# Patient Record
Sex: Female | Born: 1970
Health system: Southern US, Community
[De-identification: ages and names within clinical notes are randomized; demographics above are authoritative.]

## PROBLEM LIST (undated history)

## (undated) DIAGNOSIS — E559 Vitamin D deficiency, unspecified: Secondary | ICD-10-CM

## (undated) DIAGNOSIS — I10 Essential (primary) hypertension: Secondary | ICD-10-CM

## (undated) DIAGNOSIS — E042 Nontoxic multinodular goiter: Secondary | ICD-10-CM

## (undated) HISTORY — DX: Vitamin D deficiency, unspecified: E55.9

## (undated) HISTORY — DX: Essential (primary) hypertension: I10

## (undated) HISTORY — DX: Nontoxic multinodular goiter: E04.2

---

## 1992-10-16 DIAGNOSIS — E0500 Thyrotoxicosis with diffuse goiter without thyrotoxic crisis or storm: Secondary | ICD-10-CM

## 1992-10-16 HISTORY — DX: Thyrotoxicosis with diffuse goiter without thyrotoxic crisis or storm: E05.00

## 2016-10-31 DIAGNOSIS — F329 Major depressive disorder, single episode, unspecified: Secondary | ICD-10-CM | POA: Insufficient documentation

## 2016-10-31 DIAGNOSIS — I10 Essential (primary) hypertension: Secondary | ICD-10-CM | POA: Insufficient documentation

## 2016-10-31 DIAGNOSIS — Z8639 Personal history of other endocrine, nutritional and metabolic disease: Secondary | ICD-10-CM | POA: Insufficient documentation

## 2016-10-31 DIAGNOSIS — F32A Depression, unspecified: Secondary | ICD-10-CM | POA: Insufficient documentation

## 2016-10-31 DIAGNOSIS — F419 Anxiety disorder, unspecified: Secondary | ICD-10-CM | POA: Insufficient documentation

## 2020-03-11 DIAGNOSIS — R7401 Elevation of levels of liver transaminase levels: Secondary | ICD-10-CM | POA: Diagnosis not present

## 2020-03-11 DIAGNOSIS — R7402 Elevation of levels of lactic acid dehydrogenase (LDH): Secondary | ICD-10-CM | POA: Diagnosis not present

## 2020-03-11 DIAGNOSIS — R932 Abnormal findings on diagnostic imaging of liver and biliary tract: Secondary | ICD-10-CM | POA: Diagnosis not present

## 2020-04-01 ENCOUNTER — Ambulatory Visit (INDEPENDENT_AMBULATORY_CARE_PROVIDER_SITE_OTHER): Payer: BC Managed Care – PPO | Admitting: Gastroenterology

## 2020-05-24 ENCOUNTER — Ambulatory Visit (INDEPENDENT_AMBULATORY_CARE_PROVIDER_SITE_OTHER): Payer: BC Managed Care – PPO | Admitting: Gastroenterology

## 2021-02-09 ENCOUNTER — Telehealth: Payer: Self-pay | Admitting: Internal Medicine

## 2021-02-09 NOTE — Telephone Encounter (Signed)
Pt calling in to see if we have received labs and a thyroid scan, from  [facility].  Pt would like a call back.

## 2021-02-10 ENCOUNTER — Encounter: Payer: Self-pay | Admitting: Internal Medicine

## 2021-02-10 ENCOUNTER — Other Ambulatory Visit: Payer: Self-pay

## 2021-02-10 ENCOUNTER — Ambulatory Visit: Payer: BC Managed Care – PPO | Admitting: Internal Medicine

## 2021-02-10 VITALS — BP 120/78 | HR 61 | Ht 68.0 in | Wt 218.8 lb

## 2021-02-10 DIAGNOSIS — Z8639 Personal history of other endocrine, nutritional and metabolic disease: Secondary | ICD-10-CM | POA: Diagnosis not present

## 2021-02-10 DIAGNOSIS — E042 Nontoxic multinodular goiter: Secondary | ICD-10-CM | POA: Diagnosis not present

## 2021-02-10 NOTE — Patient Instructions (Addendum)
Please stop Levothyroxine.   Please come back for labs in 5 weeks.  We will repeat another thyroid U/S in 1 year from the previous.  Let's have you back towards the end on 11/2021. Send me a message at the beginning of February to order the new U/S.   Thyroid Nodule  A thyroid nodule is an isolated growth of thyroid cells that forms a lump in your thyroid gland. The thyroid gland is a butterfly-shaped gland. It is found in the lower front of your neck. This gland sends chemical messengers (hormones) through your blood to all parts of your body. These hormones are important in regulating your body temperature and helping your body to use energy. Thyroid nodules are common. Most are not cancerous (benign). You may have one nodule or several nodules. Different types of thyroid nodules include nodules that:  Grow and fill with fluid (thyroid cysts).  Produce too much thyroid hormone (hot nodules or hyperthyroid).  Produce no thyroid hormone (cold nodules or hypothyroid).  Form from cancer cells (thyroid cancers). What are the causes? In most cases, the cause of this condition is not known. What increases the risk? The following factors may make you more likely to develop this condition.  Age. Thyroid nodules become more common in people who are older than 48 years of age.  Gender. ? Benign thyroid nodules are more common in women. ? Cancerous (malignant) thyroid nodules are more common in men.  A family history that includes: ? Thyroid nodules. ? Pheochromocytoma. ? Thyroid carcinoma. ? Hyperparathyroidism.  Certain kinds of thyroid diseases, such as Hashimoto's thyroiditis.  Lack of iodine in your diet.  A history of head and neck radiation, such as from previous cancer treatment. What are the signs or symptoms? In many cases, there are no symptoms. If you have symptoms, they may include:  A lump in your lower neck.  Feeling a lump or tickle in your throat.  Pain in  your neck, jaw, or ear.  Having trouble swallowing. Hot nodules may cause symptoms that include:  Weight loss.  Warm, flushed skin.  Feeling hot.  Feeling nervous.  A racing heartbeat. Cold nodules may cause symptoms that include:  Weight gain.  Dry skin.  Brittle hair. This may also occur with hair loss.  Feeling cold.  Fatigue. Thyroid cancer nodules may cause symptoms that include:  Hard nodules that feel stuck to the thyroid gland.  Hoarseness.  Lumps in the glands near your thyroid (lymph nodes). How is this diagnosed? A thyroid nodule may be felt by your health care provider during a physical exam. This condition may also be diagnosed based on your symptoms. You may also have tests, including:  An ultrasound. This may be done to confirm the diagnosis.  A biopsy. This involves taking a sample from the nodule and looking at it under a microscope.  Blood tests to make sure that your thyroid is working properly.  A thyroid scan. This test uses a radioactive tracer injected into a vein to create an image of the thyroid gland on a computer screen.  Imaging tests such as MRI or CT scan. These may be done if: ? Your nodule is large. ? Your nodule is blocking your airway. ? Cancer is suspected. How is this treated? Treatment depends on the cause and size of your nodule or nodules. If the nodule is benign, treatment may not be necessary. Your health care provider may monitor the nodule to see if it goes away without  treatment. If the nodule continues to grow, is cancerous, or does not go away, treatment may be needed. Treatment may include:  Having a cystic nodule drained with a needle.  Ablation therapy. In this treatment, alcohol is injected into the area of the nodule to destroy the cells. Ablation with heat (thermal ablation) may also be used.  Radioactive iodine. In this treatment, radioactive iodine is given as a pill or liquid that you drink. This substance  causes the thyroid nodule to shrink.  Surgery to remove the nodule. Part or all of your thyroid gland may need to be removed as well.  Medicines. Follow these instructions at home:  Pay attention to any changes in your nodule.  Take over-the-counter and prescription medicines only as told by your health care provider.  Keep all follow-up visits as told by your health care provider. This is important. Contact a health care provider if:  Your voice changes.  You have trouble swallowing.  You have pain in your neck, ear, or jaw that is getting worse.  Your nodule gets bigger.  Your nodule starts to make it harder for you to breathe.  Your muscles look like they are shrinking (muscle wasting). Get help right away if:  You have chest pain.  There is a loss of consciousness.  You have a sudden fever.  You feel confused.  You are seeing or hearing things that other people do not see or hear (having hallucinations).  You feel very weak.  You have mood swings.  You feel very restless.  You feel suddenly nauseous or throw up.  You suddenly have diarrhea. Summary  A thyroid nodule is an isolated growth of thyroid cells that forms a lump in your thyroid gland.  Thyroid nodules are common. Most are not cancerous (benign). You may have one nodule or several nodules.  Treatment depends on the cause and size of your nodule or nodules. If the nodule is benign, treatment may not be necessary.  Your health care provider may monitor the nodule to see if it goes away without treatment. If the nodule continues to grow, is cancerous, or does not go away, treatment may be needed. This information is not intended to replace advice given to you by your health care provider. Make sure you discuss any questions you have with your health care provider. Document Revised: 05/17/2018 Document Reviewed: 05/20/2018 Elsevier Patient Education  2021 [employer].

## 2021-02-10 NOTE — Progress Notes (Signed)
Patient ID: Autumn Harris, female   DOB: Dec 20, 1970, 50 y.o.   MRN: [id number]  This visit occurred during the SARS-CoV-2 public health emergency.  Safety protocols were in place, including screening questions prior to the visit, additional usage of staff PPE, and extensive cleaning of exam room while observing appropriate contact time as indicated for disinfecting solutions.    HPI  Autumn Harris is a 50 y.o.-year-old female, referred by her PCP, Letitia Neri, PA/Dr. Ernst Breach at at [facility], for evaluation for thyroid nodules and history of Graves' disease.  Patient has a history of Graves' disease, diagnosed in 1994, when pregnant with her daughter. She had elevated thyroid antibodies and a scan. She was on PTU then for years. She came off ~10 years ago. She then was off medications during the next 2 pregnancies, but after the pregnancies, she had hypothyroidism. Last pregnancy was in 2005. She was started on LT4 then.  Pt is on levothyroxine 12.5 mcg daily, taken for many years: - in am - fasting - with coffee + creamer + sweetener - at least 3h from b'fast - no calcium - no iron - + multivitamins (Thrive) in am  - at the same time with LT4 - no PPIs - + vitamin D, C, and B complex (30 mcg Biotin) in am - at the same time with LT4  Thyroid U/S (11/23/2020): 2 isoechoic nodules or pseudonodules: Right lobe: 2.7 x 6 x 2.6 cm Left lobe: 1.9 x 5.6 x 2.2 cm Isthmus 0.4 cm Thyroid appears enlarged.  Thyroid echotexture appears heterogeneous bilaterally.  Parenchymal vascularity is within normal range. There are a few isoechoic predominantly solid oval or round nodules in the right thyroid with the largest measuring 1.9 x 10 mm. There are a few isoechoic predominantly solid oval or round nodules in the left thyroid with the largest measuring 12 x 12 mm.   Nodules with this appearance are typically classified as low risk for malignancy.  Some nodules could be  parts of larger nodules or be due to heterogeneous echotexture.   Given nodule size, follow-up is recommended in 1 to 2 years, however evaluation with nuclear medicine thyroid scan can be considered.  Pt denies: - feeling nodules in neck - hoarseness - dysphagia - choking - SOB with lying down  I reviewed pt's thyroid tests: 09/23/2020: TSH 2.05 (0.278-4.326), free T4 1.09 02/12/2020: TSH 0.808, free T4 1.15 No results found for: TSH, FREET4   Pt mentions: - fatigue - weight gain - hot flashes  But denies: - tremors - palpitations - anxiety/depression - hyperdefecation/constipation - weight loss/ - dry skin - hair loss  + FH of thyroid ds. No FH of thyroid cancer. No h/o radiation tx to head or neck.  No recent contrast studies. No steroid use. No herbal supplements. + Low-dose biotin supplement, but not in the last few days (see above).  Pt also has a history of transaminitis, HTN (on propranolol for almost 30 years and also HCTZ and lisinopril), depression, anxiety, vitamin D deficiency.   ROS: Constitutional: + See HPI, + increased urination Eyes: no blurry vision, no xerophthalmia ENT: no sore throat,  + see HPI Cardiovascular: no CP/SOB/palpitations/leg swelling Respiratory: no cough/SOB Gastrointestinal: no N/V/D/C Musculoskeletal: no muscle/joint aches Skin: no rashes Neurological: no tremors/numbness/tingling/dizziness Psychiatric: + both depression/anxiety  Past Medical History:  Diagnosis Date  . Graves disease 1994   Resolved  . HTN (hypertension)   . Multiple thyroid nodules   . Multiple thyroid nodules   .  Vitamin D deficiency    Social History   Socioeconomic History  . Marital status: Single    Spouse name: Not on file  . Number of children: 4  . Years of education: Not on file  . Highest education level: Not on file  Occupational History  . Occupation: spinner (textile)  Tobacco Use  . Smoking status: Former Games developer  . Smokeless  tobacco: Never Used  Substance and Sexual Activity  . Alcohol use: Never  . Drug use: Never  . Sexual activity: Not on file  Other Topics Concern  . Not on file  Social History Narrative  . Not on file   Social Determinants of Health   Financial Resource Strain: Not on file  Food Insecurity: Not on file  Transportation Needs: Not on file  Physical Activity: Not on file  Stress: Not on file  Social Connections: Not on file  Intimate Partner Violence: Not on file   Current Outpatient Medications on File Prior to Visit  Medication Sig Dispense Refill  . levothyroxine (SYNTHROID) 25 MCG tablet 1/2 tablet daily    . lisinopril-hydrochlorothiazide (ZESTORETIC) 10-12.5 MG tablet Take 1 tablet by mouth daily.    . propranolol (INDERAL) 40 MG tablet Take 40 mg by mouth 2 (two) times daily.    Marland Kitchen REXULTI 1 MG TABS tablet Take 1 mg by mouth daily.    Marland Kitchen venlafaxine XR (EFFEXOR-XR) 150 MG 24 hr capsule Take 150 mg by mouth daily.    . Vitamin D, Ergocalciferol, (DRISDOL) 1.25 MG (50000 UNIT) CAPS capsule Take 1 capsule by mouth once a week.     No current facility-administered medications on file prior to visit.   No Known Allergies No family history on file.  PE: BP 120/78 (BP Location: Right Arm, Patient Position: Sitting, Cuff Size: Normal)   Pulse 61   Ht 5\' 8"  (1.727 m)   Wt 218 lb 12.8 oz (99.2 kg)   SpO2 97%   BMI 33.27 kg/m  Wt Readings from Last 3 Encounters:  02/10/21 218 lb 12.8 oz (99.2 kg)   Constitutional: overweight, in NAD Eyes: PERRLA, EOMI, no exophthalmos ENT: moist mucous membranes, no thyromegaly, no cervical lymphadenopathy Cardiovascular: RRR, No MRG Respiratory: CTA B Gastrointestinal: abdomen soft, NT, ND, BS+ Musculoskeletal: no deformities, strength intact in all 4;  Skin: moist, warm, no rashes Neurological: no tremor with outstretched hands, DTR normal in all 4  ASSESSMENT: 1. Thyroid nodules  2.  History of Graves' disease -  resolved  PLAN: 1. Thyroid nodules - I reviewed the report of her recent thyroid ultrasound from 11/23/2020 per records from PCP.  I do not have the images.  The thyroid is slightly enlarged with heterogeneous aspect, consistent with her history of thyroid inflammation in the setting of Graves' disease.  She has 2 dominant nodules, a 1.9 cm nodule in the right lobe and a 1.2 cm nodule in the left lobe, along with other, smaller nodules scattered throughout her thyroid, which was not formally characterized on the report.  The dominant nodules appear to be around or oval in shape and they were also isoechoic in appearance.  We discussed that this is a benign finding.  There is no comment about internal calcifications, internal blood flow, more taller than wide disposition, to imply increased risk of malignancy. - Pt does not have a thyroid cancer family history or a personal history of RxTx to head/neck. All these would favor benignity.  - She denies neck compression symptoms - At  this visit, I suggested to repeat the ultrasound in a year from the previous and review the ultrasound characteristics together.  If the nodules appear to have grown significantly or change ultrasound characteristics, we may need to biopsy them.  However, for now, I would not suggest this. - We did discuss that if one of the thyroid nodules turns out to be cancerous in the future, thyroid cancer is a slow-growing cancer with very good prognosis. - I advised her to get in touch with me around 11/2021 so I can order the new thyroid ultrasound and to see her back in the clinic 2 to 3 weeks afterwards so we can review the images together and discuss about further plan. - I advised pt to join my chart   2.  History of Graves' disease -Diagnosed many years, during her first pregnancy. -She was initially on PTU, but then came off and approximately 10 years ago she developed hypothyroidism. -She was started on LT4 12.5 mcg daily and she  has been on the same dose since then -Upon questioning, she is not taking it correctly: She takes it along with multivitamins and also with coffee + creamer.  Therefore, I do not feel that she is absorbing any of the LT4 active substance.  Since she is on such a low-dose and is not absorbing it well, I feel that her hypothyroidism has resolved and I think she can safely come off.  We will stop the medication and recheck her TFTs in 5 weeks. However, we discussed about how to take the thyroid hormone correctly in case we need to start back: Every day, with water, >30 minutes before breakfast, separated by >4 hours from acid reflux medications, calcium, iron, multivitamins.  Orders Placed This Encounter  Procedures  . TSH  . T4, free  . T3, free   Carlus Pavlov, MD PhD [facility]

## 2021-02-10 NOTE — Telephone Encounter (Signed)
Called and advised pt paperwork received.

## 2021-03-17 ENCOUNTER — Other Ambulatory Visit: Payer: BC Managed Care – PPO

## 2021-05-27 DIAGNOSIS — Z1331 Encounter for screening for depression: Secondary | ICD-10-CM | POA: Diagnosis not present

## 2021-05-27 DIAGNOSIS — Z7251 High risk heterosexual behavior: Secondary | ICD-10-CM | POA: Diagnosis not present

## 2021-05-27 DIAGNOSIS — N3281 Overactive bladder: Secondary | ICD-10-CM | POA: Diagnosis not present

## 2021-05-27 DIAGNOSIS — Z01419 Encounter for gynecological examination (general) (routine) without abnormal findings: Secondary | ICD-10-CM | POA: Diagnosis not present

## 2021-05-27 DIAGNOSIS — R8290 Unspecified abnormal findings in urine: Secondary | ICD-10-CM | POA: Diagnosis not present

## 2021-05-27 DIAGNOSIS — Z113 Encounter for screening for infections with a predominantly sexual mode of transmission: Secondary | ICD-10-CM | POA: Diagnosis not present

## 2021-07-16 DIAGNOSIS — I10 Essential (primary) hypertension: Secondary | ICD-10-CM | POA: Diagnosis not present

## 2021-07-16 DIAGNOSIS — R7989 Other specified abnormal findings of blood chemistry: Secondary | ICD-10-CM | POA: Diagnosis not present

## 2021-07-16 DIAGNOSIS — Z20822 Contact with and (suspected) exposure to covid-19: Secondary | ICD-10-CM | POA: Diagnosis not present

## 2021-07-16 DIAGNOSIS — R519 Headache, unspecified: Secondary | ICD-10-CM | POA: Diagnosis not present

## 2021-07-19 DIAGNOSIS — I10 Essential (primary) hypertension: Secondary | ICD-10-CM | POA: Diagnosis not present

## 2021-07-19 DIAGNOSIS — Z20822 Contact with and (suspected) exposure to covid-19: Secondary | ICD-10-CM | POA: Diagnosis not present

## 2021-07-19 DIAGNOSIS — G932 Benign intracranial hypertension: Secondary | ICD-10-CM | POA: Diagnosis not present

## 2021-07-19 DIAGNOSIS — R231 Pallor: Secondary | ICD-10-CM | POA: Diagnosis not present

## 2021-07-19 DIAGNOSIS — F32A Depression, unspecified: Secondary | ICD-10-CM | POA: Diagnosis not present

## 2021-07-19 DIAGNOSIS — F419 Anxiety disorder, unspecified: Secondary | ICD-10-CM | POA: Diagnosis not present

## 2021-07-19 DIAGNOSIS — R7401 Elevation of levels of liver transaminase levels: Secondary | ICD-10-CM | POA: Diagnosis not present

## 2021-07-19 DIAGNOSIS — R52 Pain, unspecified: Secondary | ICD-10-CM | POA: Diagnosis not present

## 2021-07-19 DIAGNOSIS — R519 Headache, unspecified: Secondary | ICD-10-CM | POA: Diagnosis not present

## 2021-07-19 DIAGNOSIS — R0689 Other abnormalities of breathing: Secondary | ICD-10-CM | POA: Diagnosis not present

## 2021-07-19 DIAGNOSIS — G4489 Other headache syndrome: Secondary | ICD-10-CM | POA: Diagnosis not present

## 2021-07-20 DIAGNOSIS — G932 Benign intracranial hypertension: Secondary | ICD-10-CM | POA: Diagnosis not present

## 2021-07-20 DIAGNOSIS — F419 Anxiety disorder, unspecified: Secondary | ICD-10-CM | POA: Diagnosis not present

## 2021-07-20 DIAGNOSIS — F32A Depression, unspecified: Secondary | ICD-10-CM | POA: Diagnosis not present

## 2021-07-20 DIAGNOSIS — I10 Essential (primary) hypertension: Secondary | ICD-10-CM | POA: Diagnosis not present

## 2021-07-28 DIAGNOSIS — H02831 Dermatochalasis of right upper eyelid: Secondary | ICD-10-CM | POA: Diagnosis not present

## 2021-07-28 DIAGNOSIS — I10 Essential (primary) hypertension: Secondary | ICD-10-CM | POA: Diagnosis not present

## 2021-07-28 DIAGNOSIS — G932 Benign intracranial hypertension: Secondary | ICD-10-CM | POA: Diagnosis not present

## 2021-07-28 DIAGNOSIS — H02834 Dermatochalasis of left upper eyelid: Secondary | ICD-10-CM | POA: Diagnosis not present

## 2021-07-29 ENCOUNTER — Ambulatory Visit: Payer: BC Managed Care – PPO | Admitting: Neurology

## 2021-07-29 ENCOUNTER — Encounter: Payer: Self-pay | Admitting: Neurology

## 2021-07-29 ENCOUNTER — Telehealth: Payer: Self-pay | Admitting: Neurology

## 2021-07-29 VITALS — BP 109/73 | HR 70 | Ht 68.0 in | Wt 211.0 lb

## 2021-07-29 DIAGNOSIS — R0602 Shortness of breath: Secondary | ICD-10-CM

## 2021-07-29 DIAGNOSIS — G932 Benign intracranial hypertension: Secondary | ICD-10-CM | POA: Diagnosis not present

## 2021-07-29 MED ORDER — ONDANSETRON 4 MG PO TBDP: 4.0000 mg | ORAL_TABLET | Freq: Three times a day (TID) | ORAL | 6 refills | Status: DC | PRN

## 2021-07-29 MED ORDER — TOPIRAMATE 100 MG PO TABS: 100.0000 mg | ORAL_TABLET | Freq: Two times a day (BID) | ORAL | 3 refills | Status: DC

## 2021-07-29 MED ORDER — RIZATRIPTAN BENZOATE 10 MG PO TBDP: 10.0000 mg | ORAL_TABLET | ORAL | 6 refills | Status: DC | PRN

## 2021-07-29 MED ORDER — TIZANIDINE HCL 4 MG PO TABS: 4.0000 mg | ORAL_TABLET | Freq: Four times a day (QID) | ORAL | 6 refills | Status: DC | PRN

## 2021-07-29 NOTE — Progress Notes (Signed)
Chief Complaint  Patient presents with   New Patient (Initial Visit)    New room with husband. Paper referral from Ocie Bob, NP for benign IIH. Gets up to walk, gets out of breathe. Within last day, SOB has improved. Has no energy, very fatigued. Has high BP but on med for this. Had [facility] head last week [facility]. Told it was okay per pt. CAT scan showed excessive fluid, had LP that confirmed increased pressure, fluid taken off.       ASSESSMENT AND PLAN  Autumn Harris is a 50 y.o. female   Probable pseudotumor cerebri  Normal MRI of the brain, neurological examinations,  Diagnosis was based on elevated opening pressure October 4th  2022, 35 cmH2O  Per patient, recent ophthalmology evaluation by Dr. Mia Creek was normal, will get record Headache  With migraine features  Will switch from Diamox to Topamax 100 mg twice a day as preventive medications, also for the benefit of migraine prevention, weight loss  Maxalt 10 mg as needed, may mix together with tizanidine, Zofran, Aleve for prolonged severe headaches,  Return to clinic with nurse practitioner in 2 months   DIAGNOSTIC DATA (LABS, IMAGING, TESTING) - I reviewed patient records, labs, notes, testing and imaging myself where available.   MEDICAL HISTORY:  Autumn Harris, is a 50 year old female, accompanied by her husband, seen in request by her primary care PA [address] R, elevation of 2 episodes severe headache, elevated opening pressure on lumbar puncture, initial evaluation was July 29, 2021   I reviewed and summarized the referring note. PMhx. Depression, anxiety. HTN  She presented to emergency room [facility] on July 16, 2021, after sudden onset of severe headache during climax, also has shortness of breath, sweating, CT head without contrast showed no significant abnormality, EKG was normal, did has transient elevated troponin, but quickly normalized on repeat test  She  was given Tylenol, Zofran, hydralazine because elevated blood pressure 188/95, and discharged home  Her headache last about 24 hours, had recurrent headache again on July 19, 2021, describes severe pounding headache with light noise sensitivity, nauseous,  This time she had a CT angiogram head and neck without significant abnormality  MRI of the brain, MRV of the brain was normal Lumbar puncture showed opening pressure of 35 cmH2O, no significant abnormality on CSF study,  She was started on Diamox 500 mg twice a day, again headache last for 24 hours, she did have transient whooshing sound in her ear, especially when lying down, now has improved, denies visual change, denies headache.  She was seen by her ophthalmologist Dr.  Mia Creek, reported normal  But over the past couple weeks, she complains of worsening depression anxiety, could not falling to sleep, tearful, stressed at today's visit She denies a previous history of frequent headaches, CSF: July 19, 2021, protein was 45, RBC 1, WBC 1, CBC showed mild elevated WBC 11.1, hemoglobin of 15.5, CMP shows sodium 131, creatinine 0.89, elevated AST 65, ALT 101, UDS was positive for marijuana twice   PHYSICAL EXAM:   Vitals:   07/29/21 0824  BP: 109/73  Pulse: 70  Weight: 211 lb (95.7 kg)  Height: 5\' 8"  (1.727 m)   Not recorded     Body mass index is 32.08 kg/m.  PHYSICAL EXAMNIATION:  Gen: NAD, conversant, well nourised, well groomed                     Cardiovascular: Regular  rate rhythm, no peripheral edema, warm, nontender. Eyes: Conjunctivae clear without exudates or hemorrhage Neck: Supple, no carotid bruits. Pulmonary: Clear to auscultation bilaterally   NEUROLOGICAL EXAM:  MENTAL STATUS: Depressed looking middle-aged female Speech:    Speech is normal; fluent and spontaneous with normal comprehension.  Cognition:     Orientation to time, place and person     Normal recent and remote memory     Normal  Attention span and concentration     Normal Language, naming, repeating,spontaneous speech     Fund of knowledge   CRANIAL NERVES: CN II: Visual fields are full to confrontation. Pupils are round equal and briskly reactive to light. CN III, IV, VI: extraocular movement are normal. No ptosis. CN V: Facial sensation is intact to light touch CN VII: Face is symmetric with normal eye closure  CN VIII: Hearing is normal to causal conversation. CN IX, X: Phonation is normal. CN XI: Head turning and shoulder shrug are intact  MOTOR: There is no pronator drift of out-stretched arms. Muscle bulk and tone are normal. Muscle strength is normal.  REFLEXES: Reflexes are 2+ and symmetric at the biceps, triceps, knees, and ankles. Plantar responses are flexor.  SENSORY: Intact to light touch, pinprick and vibratory sensation are intact in fingers and toes.  COORDINATION: There is no trunk or limb dysmetria noted.  GAIT/STANCE: Posture is normal. Gait is steady with normal steps, base, arm swing, and turning. Heel and toe walking are normal. Tandem gait is normal.  Romberg is absent.  REVIEW OF SYSTEMS:  Full 14 system review of systems performed and notable only for as above All other review of systems were negative.   ALLERGIES: No Known Allergies  HOME MEDICATIONS: Current Outpatient Medications  Medication Sig Dispense Refill   acetaZOLAMIDE (DIAMOX) 250 MG tablet Take 500 mg by mouth 2 (two) times daily.     ALPRAZolam (XANAX) 0.5 MG tablet Take 0.5 mg by mouth 3 (three) times daily.     amitriptyline (ELAVIL) 10 MG tablet Take 10 mg by mouth at bedtime.     baclofen (LIORESAL) 20 MG tablet Take 20 mg by mouth at bedtime.     losartan-hydrochlorothiazide (HYZAAR) 100-25 MG tablet Take 0.5 tablets by mouth at bedtime.     propranolol (INDERAL) 40 MG tablet Take 40 mg by mouth 2 (two) times daily.     REXULTI 1 MG TABS tablet Take 0.5 mg by mouth daily.     venlafaxine XR  (EFFEXOR-XR) 150 MG 24 hr capsule Take 150 mg by mouth daily.     No current facility-administered medications for this visit.    PAST MEDICAL HISTORY: Past Medical History:  Diagnosis Date   Graves disease 1994   Resolved   HTN (hypertension)    Multiple thyroid nodules    Multiple thyroid nodules    Vitamin D deficiency     PAST SURGICAL HISTORY: Past Surgical History:  Procedure Laterality Date   CESAREAN SECTION  2004   x2    FAMILY HISTORY: Family History  Problem Relation Age of Onset   Hypertension Mother    Anxiety disorder Mother    Lung cancer Father     SOCIAL HISTORY: Social History   Socioeconomic History   Marital status: Single    Spouse name: Not on file   Number of children: 4   Years of education: Not on file   Highest education level: Not on file  Occupational History   Occupation: Facilities manager (textile)  Tobacco  Use   Smoking status: Former    Types: E-cigarettes    Quit date: 07/15/2021    Years since quitting: 0.0   Smokeless tobacco: Never  Substance and Sexual Activity   Alcohol use: Never   Drug use: Never   Sexual activity: Not on file  Other Topics Concern   Not on file  Social History Narrative   Right handed   Caffeine use: small amount daily   Social Determinants of Health   Financial Resource Strain: Not on file  Food Insecurity: Not on file  Transportation Needs: Not on file  Physical Activity: Not on file  Stress: Not on file  Social Connections: Not on file  Intimate Partner Violence: Not on file      Levert Feinstein, M.D. Ph.D.  [facility] [address], [address] [place], [place] [address] Ph: [phone] Fax: [phone]  CC:  Terance Hart, PA-C [address] [place],  [place] [address]  Pcp, No

## 2021-07-29 NOTE — Telephone Encounter (Signed)
Get medical record from her neurologist Dr.Bevis, Marcial Pacas

## 2021-08-01 DIAGNOSIS — R9431 Abnormal electrocardiogram [ECG] [EKG]: Secondary | ICD-10-CM | POA: Diagnosis not present

## 2021-08-01 DIAGNOSIS — I10 Essential (primary) hypertension: Secondary | ICD-10-CM | POA: Diagnosis not present

## 2021-08-01 DIAGNOSIS — R079 Chest pain, unspecified: Secondary | ICD-10-CM | POA: Diagnosis not present

## 2021-08-01 DIAGNOSIS — Z6831 Body mass index (BMI) 31.0-31.9, adult: Secondary | ICD-10-CM | POA: Diagnosis not present

## 2021-08-01 NOTE — Telephone Encounter (Signed)
Request made today  

## 2021-08-10 DIAGNOSIS — R9431 Abnormal electrocardiogram [ECG] [EKG]: Secondary | ICD-10-CM | POA: Diagnosis not present

## 2021-08-15 ENCOUNTER — Ambulatory Visit: Payer: BC Managed Care – PPO | Admitting: Psychiatry

## 2021-08-31 DIAGNOSIS — R079 Chest pain, unspecified: Secondary | ICD-10-CM | POA: Diagnosis not present

## 2021-09-02 ENCOUNTER — Telehealth: Payer: Self-pay | Admitting: Neurology

## 2021-09-02 NOTE — Addendum Note (Signed)
Addended by: Lindell Spar C on: 09/02/2021 11:55 AM   Modules accepted: Orders

## 2021-09-02 NOTE — Telephone Encounter (Signed)
I returned the call to the patient. Reports having a near constant headache. She has been using Aleve every day. She has continued topirmate 100mg , one tab BID. She has rizatriptan, ondansetron and tizanidine at home.   I educated her that using daily NSAIDS cause medication rebound headaches. She needs to discontinue use of daily Aleve. Do not treat mild headaches. Only use the prescribed medications above for moderate to severe pain.  She is agreeable to this plan and will try it over the next few days. She will call our office next week, if the headaches do not improve with just the withdrawal of NSAIDS.

## 2021-09-02 NOTE — Telephone Encounter (Signed)
Pt called states she is having a headache everyday, and it seems the medicines we have prescribed for her are not helping with her headaches. She states she is having to take an Aleve everyday and doesn't want to have to do that everyday. Pt requesting a call back.

## 2021-09-06 DIAGNOSIS — H02831 Dermatochalasis of right upper eyelid: Secondary | ICD-10-CM | POA: Diagnosis not present

## 2021-09-06 DIAGNOSIS — I10 Essential (primary) hypertension: Secondary | ICD-10-CM | POA: Diagnosis not present

## 2021-09-06 DIAGNOSIS — H02834 Dermatochalasis of left upper eyelid: Secondary | ICD-10-CM | POA: Diagnosis not present

## 2021-09-06 DIAGNOSIS — G932 Benign intracranial hypertension: Secondary | ICD-10-CM | POA: Diagnosis not present

## 2021-09-12 DIAGNOSIS — Z1231 Encounter for screening mammogram for malignant neoplasm of breast: Secondary | ICD-10-CM | POA: Diagnosis not present

## 2021-10-12 NOTE — Progress Notes (Signed)
PATIENT: Autumn Harris DOB: [birth year]  REASON FOR VISIT: Follow up HISTORY FROM: Patient PRIMARY NEUROLOGIST: Dr. Terrace Arabia   HISTORY  Autumn Harris, is a 50 year old female, accompanied by her husband, seen in request by her primary care PA [address] R, elevation of 2 episodes severe headache, elevated opening pressure on lumbar puncture, initial evaluation was July 29, 2021   I reviewed and summarized the referring note. PMhx. Depression, anxiety. HTN   She presented to emergency room [facility] on July 16, 2021, after sudden onset of severe headache during climax, also has shortness of breath, sweating, CT head without contrast showed no significant abnormality, EKG was normal, did has transient elevated troponin, but quickly normalized on repeat test  She was given Tylenol, Zofran, hydralazine because elevated blood pressure 188/95, and discharged home  Her headache last about 24 hours, had recurrent headache again on July 19, 2021, describes severe pounding headache with light noise sensitivity, nauseous,  This time she had a CT angiogram head and neck without significant abnormality  MRI of the brain, MRV of the brain was normal Lumbar puncture showed opening pressure of 35 cmH2O, no significant abnormality on CSF study,  She was started on Diamox 500 mg twice a day, again headache last for 24 hours, she did have transient whooshing sound in her ear, especially when lying down, now has improved, denies visual change, denies headache.  She was seen by her ophthalmologist Dr.  Mia Creek, reported normal  But over the past couple weeks, she complains of worsening depression anxiety, could not falling to sleep, tearful, stressed at today's visit She denies a previous history of frequent headaches, CSF: July 19, 2021, protein was 45, RBC 1, WBC 1, CBC showed mild elevated WBC 11.1, hemoglobin of 15.5, CMP shows sodium 131, creatinine 0.89, elevated  AST 65, ALT 101, UDS was positive for marijuana twice  Update October 13, 2021 SS: Here today with husband, daughter. On Topamax 100 mg twice daily. Feels cognitive slowing, foggy. 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 No more headaches. Claims didn't give Diamox much of a chance to work, because of coming off the Vaping with THC. Saw eye doctor, Dr. Mia Creek 1 month ago, everything was good. Husband reports snoring, apnea, she admits daytime fatigue, likelihood of falling asleep during the day with normal activities. Tearful today. Anxiety and depression before, but more so lately. Taking Xanax, baclofen, Rexulti, Inderal, Effexor.  REVIEW OF SYSTEMS: Out of a complete 14 system review of symptoms, the patient complains only of the following symptoms, and all other reviewed systems are negative.  See HPI  ALLERGIES: No Known Allergies  HOME MEDICATIONS: Outpatient Medications Prior to Visit  Medication Sig Dispense Refill   ALPRAZolam (XANAX) 0.5 MG tablet Take 0.5 mg by mouth 3 (three) times daily as needed.     amitriptyline (ELAVIL) 10 MG tablet Take 10 mg by mouth at bedtime.     baclofen (LIORESAL) 20 MG tablet Take 20 mg by mouth at bedtime.     losartan-hydrochlorothiazide (HYZAAR) 100-25 MG tablet Take 0.5 tablets by mouth at bedtime.     ondansetron (ZOFRAN ODT) 4 MG disintegrating tablet Take 1 tablet (4 mg total) by mouth every 8 (eight) hours as needed. 20 tablet 6   propranolol (INDERAL) 40 MG tablet Take 40 mg by mouth 2 (two) times daily.     REXULTI 1 MG TABS tablet Take 0.5 mg by mouth daily.  rizatriptan (MAXALT-MLT) 10 MG disintegrating tablet Take 1 tablet (10 mg total) by mouth as needed. May repeat in 2 hours if needed 12 tablet 6   tiZANidine (ZANAFLEX) 4 MG tablet Take 1 tablet (4 mg total) by mouth every 6 (six) hours as needed. 30 tablet 6   topiramate (TOPAMAX) 100 MG tablet Take 1 tablet (100 mg total) by mouth 2 (two) times  daily. 60 tablet 3   venlafaxine XR (EFFEXOR-XR) 150 MG 24 hr capsule Take 150 mg by mouth daily.     No facility-administered medications prior to visit.    PAST MEDICAL HISTORY: Past Medical History:  Diagnosis Date   Graves disease 1994   Resolved   HTN (hypertension)    Multiple thyroid nodules    Multiple thyroid nodules    Vitamin D deficiency     PAST SURGICAL HISTORY: Past Surgical History:  Procedure Laterality Date   CESAREAN SECTION  2004   x2    FAMILY HISTORY: Family History  Problem Relation Age of Onset   Hypertension Mother    Anxiety disorder Mother    Lung cancer Father     SOCIAL HISTORY: Social History   Socioeconomic History   Marital status: Single    Spouse name: Not on file   Number of children: 4   Years of education: Not on file   Highest education level: Not on file  Occupational History   Occupation: Facilities manager (textile)  Tobacco Use   Smoking status: Former    Types: E-cigarettes    Quit date: 07/15/2021    Years since quitting: 0.2   Smokeless tobacco: Never  Substance and Sexual Activity   Alcohol use: Never   Drug use: Never   Sexual activity: Not on file  Other Topics Concern   Not on file  Social History Narrative   Right handed   Caffeine use: small amount daily   Social Determinants of Health   Financial Resource Strain: Not on file  Food Insecurity: Not on file  Transportation Needs: Not on file  Physical Activity: Not on file  Stress: Not on file  Social Connections: Not on file  Intimate Partner Violence: Not on file   PHYSICAL EXAM  Vitals:   10/13/21 0853  BP: 114/73  Pulse: 78  Weight: 211 lb 8 oz (95.9 kg)  Height: 5\' 8"  (1.727 m)   Body mass index is 32.16 kg/m.  Generalized: Well developed, in no acute distress, tired appearing, tearful Neurological examination  Mentation: Alert oriented to time, place, history taking. Follows all commands speech and language fluent Cranial nerve II-XII:  Pupils were equal round reactive to light. Extraocular movements were full, visual field were full on confrontational test. Facial sensation and strength were normal. Head turning and shoulder shrug  were normal and symmetric. Motor: The motor testing reveals 5 over 5 strength of all 4 extremities. Good symmetric motor tone is noted throughout.  Sensory: Sensory testing is intact to soft touch on all 4 extremities. No evidence of extinction is noted.  Coordination: Cerebellar testing reveals good finger-nose-finger and heel-to-shin bilaterally.  Gait and station: Gait is normal. Tandem gait is normal.   Reflexes: Deep tendon reflexes are symmetric and normal bilaterally.   DIAGNOSTIC DATA (LABS, IMAGING, TESTING) - I reviewed patient records, labs, notes, testing and imaging myself where available.  No results found for: WBC, HGB, HCT, MCV, PLT No results found for: NA, K, CL, CO2, GLUCOSE, BUN, CREATININE, CALCIUM, PROT, ALBUMIN, AST, ALT, ALKPHOS, BILITOT, GFRNONAA,  GFRAA No results found for: CHOL, HDL, LDLCALC, LDLDIRECT, TRIG, CHOLHDL No results found for: WGNF6O No results found for: VITAMINB12 No results found for: TSH    ASSESSMENT AND PLAN 51 y.o. year old female  has a past medical history of Graves disease (1994), HTN (hypertension), Multiple thyroid nodules, Multiple thyroid nodules, and Vitamin D deficiency. here with:  1.  Probable pseudotumor cerebri 2.  Headache, with migraine features -No longer complains of significant headache -Will try taking Topamax 200 mg at bedtime, to see if less side effect of cognitive clouding, if no change may consider retry of Diamox, admits she didn't give it much of a chance; also isn't sleeping well, more anxiety contributing as well? -Will request the report from recent ophthalmology visit with Dr. Mia Creek, reportedly was normal -Normal MRI of the brain -Diagnosis made based on elevated opening pressure July 19, 2021, 35 cm  water -On polypharmacy of Xanax, baclofen, Rexulti, propanolol, Effexor -She will follow-up with me in 3 to 4 months or sooner if needed, I have asked her to keep me updated via MyChart  3. Reported snoring, apnea, daytime fatigue -Referral for sleep consultation, rule out obstructive sleep apnea -ESS was 21 -History significant for depression, anxiety; polypharmacy  Margie Ege, AGNP-C, DNP 10/13/2021, 9:03 AM [facility] [address] [place], [place] [address] [phone]

## 2021-10-13 ENCOUNTER — Ambulatory Visit (INDEPENDENT_AMBULATORY_CARE_PROVIDER_SITE_OTHER): Payer: BC Managed Care – PPO | Admitting: Neurology

## 2021-10-13 ENCOUNTER — Encounter: Payer: Self-pay | Admitting: Neurology

## 2021-10-13 ENCOUNTER — Telehealth: Payer: Self-pay | Admitting: Neurology

## 2021-10-13 VITALS — BP 114/73 | HR 78 | Ht 68.0 in | Wt 211.5 lb

## 2021-10-13 DIAGNOSIS — R0683 Snoring: Secondary | ICD-10-CM | POA: Diagnosis not present

## 2021-10-13 DIAGNOSIS — G932 Benign intracranial hypertension: Secondary | ICD-10-CM | POA: Diagnosis not present

## 2021-10-13 NOTE — Patient Instructions (Signed)
Try taking Topamax 200 mg at bedtime to see if less symptoms  Referral for sleep consult  I will get report of eye visit  Follow up with me 3-4 months  Keep me updated via my chart how you are doing

## 2021-10-13 NOTE — Telephone Encounter (Signed)
Stanton Kidney, can you please get records from Dr. Mia Creek most recent eye exam? Thanks!

## 2021-10-18 NOTE — Progress Notes (Signed)
Chart reviewed, agree above plan ?

## 2021-11-06 ENCOUNTER — Encounter: Payer: Self-pay | Admitting: Neurology

## 2021-11-08 ENCOUNTER — Telehealth: Payer: Self-pay | Admitting: Neurology

## 2021-11-08 MED ORDER — ACETAZOLAMIDE 250 MG PO TABS: 500.0000 mg | ORAL_TABLET | Freq: Two times a day (BID) | ORAL | 11 refills | Status: DC

## 2021-11-08 NOTE — Telephone Encounter (Signed)
Patient sent my chart message requesting to switch back to Diamox, reported too many side effect of Topamax.   Meds ordered this encounter  Medications   acetaZOLAMIDE (DIAMOX) 250 MG tablet    Sig: Take 2 tablets (500 mg total) by mouth 2 (two) times daily.    Dispense:  120 tablet    Refill:  11

## 2021-11-09 ENCOUNTER — Telehealth: Payer: Self-pay | Admitting: Neurology

## 2021-11-09 NOTE — Telephone Encounter (Signed)
I received ophthalmology note, September 06, 2021, from Dr. Mia Creek.  Bilaterally, the optic nerve was pink and healthy, no disc edema.   I was able to review with Dr. Terrace Arabia, since eye exam is reassuring, it is ok for her to try to stop Diamox. However, she should let us know immediately of any return of headache. Would then proceed with LP. Needs to see eye doctor every 6 months.   She has had a hard time tolerating the Diamox and Topamax.  I tried to call the patient, there was no answer. Can you try to reach her tomorrow?

## 2021-11-10 ENCOUNTER — Other Ambulatory Visit: Payer: Self-pay | Admitting: Neurology

## 2021-11-10 NOTE — Telephone Encounter (Signed)
Maralyn Sago has sent the patient the following mychart message:  Glean Salvo, NP to Autumn Harris      9:37 AM [name], I got the records yesterday from your eye doctor, the exam didn't show any disc edema. If you are having toleration issues, we could try a discontinuation of medication and observe for any return of symptom. You would need to let me know of any return of headache immediately. Let me know your thoughts!

## 2021-11-10 NOTE — Telephone Encounter (Signed)
I was able to speak to the patient. She was in agreement with stopping the medication. Verbalized understanding to call our office immediately if headaches return.

## 2021-11-16 ENCOUNTER — Ambulatory Visit: Payer: BC Managed Care – PPO | Admitting: Internal Medicine

## 2021-11-16 ENCOUNTER — Encounter: Payer: Self-pay | Admitting: Internal Medicine

## 2021-11-16 ENCOUNTER — Other Ambulatory Visit: Payer: Self-pay

## 2021-11-16 VITALS — BP 110/78 | HR 69 | Ht 68.0 in | Wt 213.2 lb

## 2021-11-16 DIAGNOSIS — E042 Nontoxic multinodular goiter: Secondary | ICD-10-CM | POA: Diagnosis not present

## 2021-11-16 DIAGNOSIS — Z8639 Personal history of other endocrine, nutritional and metabolic disease: Secondary | ICD-10-CM | POA: Diagnosis not present

## 2021-11-16 LAB — T4, FREE: Free T4: 0.87 ng/dL (ref 0.60–1.60)

## 2021-11-16 LAB — T3, FREE: T3, Free: 3.6 pg/mL (ref 2.3–4.2)

## 2021-11-16 LAB — TSH: TSH: 3 u[IU]/mL (ref 0.35–5.50)

## 2021-11-16 NOTE — Patient Instructions (Signed)
Please stop at the lab.  We will check annual thyroid ultrasound.  Please return for another visit in a year.

## 2021-11-16 NOTE — Progress Notes (Addendum)
Patient ID: Autumn AuerbachDana Harris, female   DOB: 10/17/1970, 51 y.o.   MRN: [id number]  This visit occurred during the SARS-CoV-2 public health emergency.  Safety protocols were in place, including screening questions prior to the visit, additional usage of staff PPE, and extensive cleaning of exam room while observing appropriate contact time as indicated for disinfecting solutions.   HPI  Autumn AuerbachDana Harris is a 51 y.o.-year-old female, initially referred by her PCP, Letitia Neriourtney Eure-Hart, PA/Dr. Ernst BreachJohn [facility], returning for follow-up for thyroid nodules and history of Graves' disease.  Last visit 9 months ago.  Interim history: Since last visit, she was diagnosed with pseudotumor cerebri in 06/2021. She was started on Diamox, then switched to Topamax. However, her intraocular pressure increased, had blurry vision, foggy mind, memory problems >> stopped Topamax. She is feeling much better now without above medications. She lost weight: 26 lbs - since 06/2021.  She has less fatigue.   Graves' disease - diagnosed in 1994, when pregnant with her daughter. She had elevated thyroid antibodies and a positive scan.  - She was on PTU then for years. She came off ~10 years ago.  - She then was off medications during the next 2 pregnancies, but after the pregnancies, she developed hypothyroidism. Last pregnancy was in 2005. She was started on LT4 then.  At last visit she was on levothyroxine 12.5 mcg daily, taken for many years.  However, she was taking it along with creamer and multivitamins >> we stopped the hormone.  Subsequent labs were normal.  I reviewed pt's thyroid tests: 07/16/2021: TSH 2.3 09/23/2020: TSH 2.05 (0.278-4.326), free T4 1.09 02/12/2020: TSH 0.808, free T4 1.15 No results found for: TSH, FREET4   She is on  vitamin D, C, and B complex (30 mcg Biotin) in am.  Thyroid U/S (11/23/2020): 2 isoechoic nodules or pseudonodules: Right lobe: 2.7 x 6 x 2.6 cm Left  lobe: 1.9 x 5.6 x 2.2 cm Isthmus 0.4 cm Thyroid appears enlarged.  Thyroid echotexture appears heterogeneous bilaterally.  Parenchymal vascularity is within normal range. There are a few isoechoic predominantly solid oval or round nodules in the right thyroid with the largest measuring 1.9 x 10 mm. There are a few isoechoic predominantly solid oval or round nodules in the left thyroid with the largest measuring 12 x 12 mm.   Nodules with this appearance are typically classified as low risk for malignancy.  Some nodules could be parts of larger nodules or be due to heterogeneous echotexture.   Given nodule size, follow-up is recommended in 1 to 2 years, however evaluation with nuclear medicine thyroid scan can be considered.  Pt denies: - feeling nodules in neck - hoarseness - dysphagia - choking - SOB with lying down  + FH of thyroid ds. No FH of thyroid cancer. No h/o radiation tx to head or neck.  No recent contrast studies. No steroid use. No herbal supplements. + Low-dose biotin supplement.  Pt also has a history of transaminitis, HTN (on propranolol for almost 30 years and also HCTZ and lisinopril), depression, anxiety, vitamin D deficiency.   ROS: + see HPI  Past Medical History:  Diagnosis Date   Graves disease 1994   Resolved   HTN (hypertension)    Multiple thyroid nodules    Multiple thyroid nodules    Vitamin D deficiency    Social History   Socioeconomic History   Marital status: Single    Spouse name: Not on file   Number  of children: 4   Years of education: Not on file   Highest education level: Not on file  Occupational History   Occupation: spinner (textile)  Tobacco Use   Smoking status: Former    Types: E-cigarettes    Quit date: 07/15/2021    Years since quitting: 0.3   Smokeless tobacco: Never  Substance and Sexual Activity   Alcohol use: Never   Drug use: Never   Sexual activity: Not on file  Other Topics Concern   Not on file  Social History  Narrative   Right handed   Caffeine use: small amount daily   Social Determinants of Health   Financial Resource Strain: Not on file  Food Insecurity: Not on file  Transportation Needs: Not on file  Physical Activity: Not on file  Stress: Not on file  Social Connections: Not on file  Intimate Partner Violence: Not on file   Current Outpatient Medications on File Prior to Visit  Medication Sig Dispense Refill   acetaZOLAMIDE (DIAMOX) 250 MG tablet Take 2 tablets (500 mg total) by mouth 2 (two) times daily. 120 tablet 11   ALPRAZolam (XANAX) 0.5 MG tablet Take 0.5 mg by mouth 3 (three) times daily as needed.     amitriptyline (ELAVIL) 10 MG tablet Take 10 mg by mouth at bedtime.     baclofen (LIORESAL) 20 MG tablet Take 20 mg by mouth at bedtime.     losartan-hydrochlorothiazide (HYZAAR) 100-25 MG tablet Take 0.5 tablets by mouth at bedtime.     ondansetron (ZOFRAN ODT) 4 MG disintegrating tablet Take 1 tablet (4 mg total) by mouth every 8 (eight) hours as needed. 20 tablet 6   propranolol (INDERAL) 40 MG tablet Take 40 mg by mouth 2 (two) times daily.     REXULTI 1 MG TABS tablet Take 0.5 mg by mouth daily.     rizatriptan (MAXALT-MLT) 10 MG disintegrating tablet Take 1 tablet (10 mg total) by mouth as needed. May repeat in 2 hours if needed 12 tablet 6   tiZANidine (ZANAFLEX) 4 MG tablet Take 1 tablet (4 mg total) by mouth every 6 (six) hours as needed. 30 tablet 6   venlafaxine XR (EFFEXOR-XR) 150 MG 24 hr capsule Take 150 mg by mouth daily.     No current facility-administered medications on file prior to visit.   No Known Allergies Family History  Problem Relation Age of Onset   Hypertension Mother    Anxiety disorder Mother    Lung cancer Father     PE: BP 110/78 (BP Location: Left Arm, Patient Position: Sitting, Cuff Size: Normal)    Pulse 69    Ht 5\' 8"  (1.727 m)    Wt 213 lb 3.2 oz (96.7 kg)    SpO2 98%    BMI 32.42 kg/m  Wt Readings from Last 3 Encounters:  11/16/21  213 lb 3.2 oz (96.7 kg)  10/13/21 211 lb 8 oz (95.9 kg)  07/29/21 211 lb (95.7 kg)   Constitutional: overweight, in NAD Eyes: PERRLA, EOMI, no exophthalmos ENT: moist mucous membranes, no thyromegaly, no cervical lymphadenopathy Cardiovascular: RRR, No MRG Respiratory: CTA B Musculoskeletal: no deformities, strength intact in all 4;  Skin: moist, warm, no rashes Neurological: no tremor with outstretched hands, DTR normal in all 4  ASSESSMENT: 1. Thyroid nodules  2.  History of Graves' disease - resolved  PLAN: 1. Thyroid nodules - I reviewed the report of her recent thyroid ultrasound from 11/23/2020 per records from PCP.  Unfortunately, I  do not have the images.  The thyroid was reported to be slightly enlarged, with heterogeneous aspect, consistent with her history of thyroid inflammation in the setting of Graves' disease.  She has 2 dominant nodules, 1.9 cm in the right lobe and 1.2 cm in the left lobe, along with other, smaller, nodules, scattered throughout the thyroid, not formally characterized on the report.  The dominant nodules appeared to be round or oval in shape and they were isoechoic in appearance, which would point towards a benign finding.  There was no comment about internal calcifications, internal blood flow, taller than wide disposition, to imply an increased risk of malignancy -She does not have a thyroid cancer family history or personal history of radiation therapy to head or neck -  favoring benignity -She denies neck compression symptoms -At today's visit, we will order new thyroid ultrasound -we discussed that if the nodules appear to have changed characteristics or enlarge, we may need a biopsy.  -I will see her back in a year, but possibly sooner for labs  2.  History of Graves' disease -Diagnosed many years back, during her first pregnancy. -She was initially on PTU, but then came off and developed hypothyroidism approximately 10 years ago -At last visit, she  was on 12.5 mg of levothyroxine daily, however, upon questioning, she was not taking it correctly.  She was taking it along with multivitamins and also coffee + creamer.  Therefore, I did not feel that she was absorbing any of them.  I advised her to stop the medication and come back for labs in 5 weeks -Subsequent labs were normal, so we did not restart levothyroxine: 07/2021: TSH 2.3 -At this visit, I will recheck her TFTs.  Component     Latest Ref Rng & Units 11/16/2021  TSH     0.35 - 5.50 uIU/mL 3.00  Triiodothyronine,Free,Serum     2.3 - 4.2 pg/mL 3.6  T4,Free(Direct)     0.60 - 1.60 ng/dL 2.87   Normal TFTs.  For now, we will continue without levothyroxine.  Thyroid U/S (12/05/2021): Parenchymal Echotexture: Moderately heterogenous  Isthmus: 6 mm  Right lobe: 5.9 x 2.1 x 2.1 cm  Left lobe: 5.9 x 1.8 x 2.1 cm  __________________________________________________   Moderately heterogeneous mildly enlarged thyroid gland with pseudo nodularity. No discrete measurable nodule or focal abnormality. No hypervascularity. No regional adenopathy.   IMPRESSION: Heterogeneous thyroid gland as above compatible with chronic medical thyroid disease. Negative for nodule.  Carlus Pavlov, MD PhD [facility] Endocrinology

## 2021-12-05 ENCOUNTER — Ambulatory Visit
Admission: RE | Admit: 2021-12-05 | Discharge: 2021-12-05 | Disposition: A | Discharge status: Home / Self Care | Payer: BC Managed Care – PPO | Source: Ambulatory Visit | Attending: Internal Medicine | Admitting: Internal Medicine

## 2021-12-05 DIAGNOSIS — E049 Nontoxic goiter, unspecified: Secondary | ICD-10-CM | POA: Diagnosis not present

## 2021-12-05 DIAGNOSIS — E042 Nontoxic multinodular goiter: Secondary | ICD-10-CM

## 2022-01-03 ENCOUNTER — Institutional Professional Consult (permissible substitution): Payer: BC Managed Care – PPO | Admitting: Neurology

## 2022-02-21 NOTE — Progress Notes (Addendum)
\ Virtual Visit via Video Note  I connected with Autumn Harris on 02/22/22 at  8:45 AM EDT by a video enabled telemedicine application and verified that I am speaking with the correct person using two identifiers.  Location: Patient: at her home, in the bed Provider: in the office    I discussed the limitations of evaluation and management by telemedicine and the availability of in person appointments. The patient expressed understanding and agreed to proceed.  History of Present Illness: HISTORY  Autumn Harris, is a 51 year old female, accompanied by her husband, seen in request by her primary care PA [address], [place], elevation of 2 episodes severe headache, elevated opening pressure on lumbar puncture, initial evaluation was July 29, 2021   I reviewed and summarized the referring note. PMhx. Depression, anxiety. HTN   She presented to emergency room [facility] on July 16, 2021, after sudden onset of severe headache during climax, also has shortness of breath, sweating, CT head without contrast showed no significant abnormality, EKG was normal, did has transient elevated troponin, but quickly normalized on repeat test  She was given Tylenol, Zofran, hydralazine because elevated blood pressure 188/95, and discharged home  Her headache last about 24 hours, had recurrent headache again on July 19, 2021, describes severe pounding headache with light noise sensitivity, nauseous,  This time she had a CT angiogram head and neck without significant abnormality  MRI of the brain, MRV of the brain was normal Lumbar puncture showed opening pressure of 35 cmH2O, no significant abnormality on CSF study,  She was started on Diamox 500 mg twice a day, again headache last for 24 hours, she did have transient whooshing sound in her ear, especially when lying down, now has improved, denies visual change, denies headache.  She was seen by her ophthalmologist Dr.  Darleen Crocker, reported normal  But over the past couple weeks, she complains of worsening depression anxiety, could not falling to sleep, tearful, stressed at today's visit She denies a previous history of frequent headaches, CSF: July 19, 2021, protein was 45, RBC 1, WBC 1, CBC showed mild elevated WBC 11.1, hemoglobin of 15.5, CMP shows sodium 131, creatinine 0.89, elevated AST 65, ALT 101, UDS was positive for marijuana twice   Update October 13, 2021 SS: Here today with husband, daughter. On Topamax 100 mg twice daily. Feels cognitive slowing, foggy. 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 No more headaches. Claims didn't give Diamox much of a chance to work, because of coming off the Vaping with THC. Saw eye doctor, Dr. Darleen Crocker 1 month ago, everything was good. Husband reports snoring, apnea, she admits daytime fatigue, likelihood of falling asleep during the day with normal activities. Tearful today. Anxiety and depression before, but more so lately. Taking Xanax, baclofen, Rexulti, Inderal, Effexor.   Update Feb 22, 2022 SS: Via virtual visit, currently has GI, denies any recent headaches since last seen, has stopped Topamax and Diamox.  She feels back to herself. Felt very foggy with both Topamax and Diamox.  Seeing eye doctor, Dr. Talbert Forest end of the month.  No weight loss.  She is back to working 12-hour shifts.  She had canceled her sleep consult.  Her father passed away at that time, has had a lot going on.  She remains on Effexor, propanolol, amitriptyline from PCP. Has not need Maxalt.  I received ophthalmology note, September 06, 2021, from Dr. Darleen Crocker.  Bilaterally, the optic nerve  was pink and healthy, no disc edema  Observations/Objective: Via virtual visit, is alert and oriented, facial symmetry noted, speech is clear and concise, laying in the bed, is currently ill  Assessment and Plan: 1.  Probable pseudotumor cerebri 2.  Headache with migraine  features -Off Topamax or Diamox since January 2023, denies any headaches since (does remains on propranolol, amitriptyline, Effexor that could benefit headaches) -Encouraged to keep follow-up with eye doctor Dr. Talbert Forest end of the month, let me know via MyChart the results, I can request the records -Will remain off Topamax or Diamox for now, she will let me know if headaches return immediately -Discussed the benefit of weight loss for long-term management of pseudotumor cerebri -Referral placed for sleep consult, snoring, daytime drowsiness, ESS at last office visit 21, does have history significant for depression, anxiety, polypharmacy  Addendum 03/21/2022 SS: I received ophthalmology evaluation from Dr. Talbert Forest, 03/09/22, is no longer on Topamax.  No signs of intercranial pressure were seen.  All tests are stable.  Follow-up in 6 months.  If continues to stay stable, consider annual follow-ups.  No disc edema was seen.  Follow Up Instructions: 1 year with me Feb 27, 2022 at 845   I discussed the assessment and treatment plan with the patient. The patient was provided an opportunity to ask questions and all were answered. The patient agreed with the plan and demonstrated an understanding of the instructions.   The patient was advised to call back or seek an in-person evaluation if the symptoms worsen or if the condition fails to improve as anticipated.  Evangeline Dakin, DNP  [facility] [address] [place], [place] [address] [phone]

## 2022-02-22 ENCOUNTER — Telehealth: Payer: BC Managed Care – PPO | Admitting: Neurology

## 2022-02-22 ENCOUNTER — Telehealth: Payer: Self-pay | Admitting: Neurology

## 2022-02-22 ENCOUNTER — Encounter: Payer: Self-pay | Admitting: Neurology

## 2022-02-22 DIAGNOSIS — G932 Benign intracranial hypertension: Secondary | ICD-10-CM | POA: Diagnosis not present

## 2022-02-22 DIAGNOSIS — R0683 Snoring: Secondary | ICD-10-CM | POA: Diagnosis not present

## 2022-02-22 NOTE — Telephone Encounter (Signed)
..   Pt understands that although there may be some limitations with this type of visit, we will take all precautions to reduce any security or privacy concerns.  Pt understands that this will be treated like an in office visit and we will file with pt's insurance, and there may be a patient responsible charge related to this service. ? ?

## 2022-02-22 NOTE — Patient Instructions (Signed)
I hope you feel better! ?Please let me know after your eye doctor appointment with Dr. Talbert Forest, I can then request the records, if your headaches do return, let me know immediately otherwise, I will see you in 1 year! :)  ?

## 2022-03-09 DIAGNOSIS — H02834 Dermatochalasis of left upper eyelid: Secondary | ICD-10-CM | POA: Diagnosis not present

## 2022-03-09 DIAGNOSIS — H02831 Dermatochalasis of right upper eyelid: Secondary | ICD-10-CM | POA: Diagnosis not present

## 2022-03-09 DIAGNOSIS — I10 Essential (primary) hypertension: Secondary | ICD-10-CM | POA: Diagnosis not present

## 2022-03-09 DIAGNOSIS — G932 Benign intracranial hypertension: Secondary | ICD-10-CM | POA: Diagnosis not present

## 2022-08-11 ENCOUNTER — Other Ambulatory Visit: Payer: Self-pay | Admitting: Neurology

## 2022-11-17 ENCOUNTER — Ambulatory Visit: Payer: BC Managed Care – PPO | Admitting: Internal Medicine

## 2022-12-21 ENCOUNTER — Ambulatory Visit: Payer: BC Managed Care – PPO | Admitting: Internal Medicine

## 2023-02-28 ENCOUNTER — Ambulatory Visit: Payer: BC Managed Care – PPO | Admitting: Neurology

## 2023-06-20 IMAGING — US US THYROID
1 series · 14 of 25 positions shown · non-contrast
Comparison: None available

CLINICAL DATA: Multinodular thyroid

EXAM:
THYROID ULTRASOUND
TECHNIQUE: Ultrasound examination of the thyroid gland and adjacent soft
tissues was performed.

[Series 1: us thyroid · 0.05mm/px · 14 of 49 slices shown]
[im 1/49]
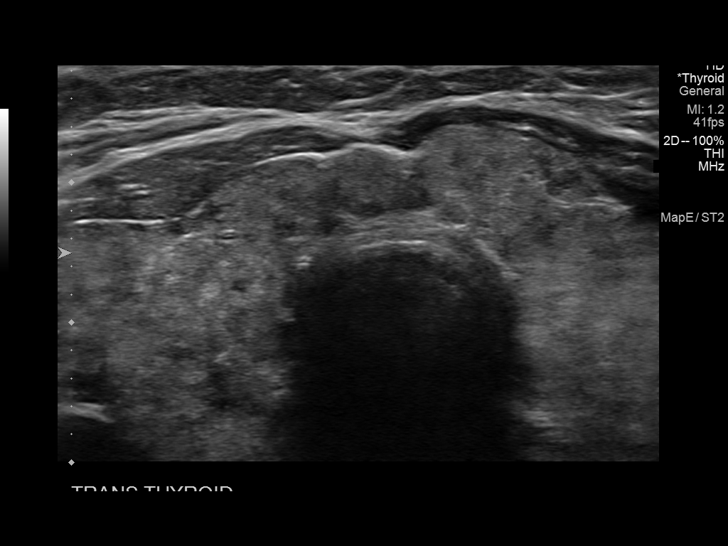
[im 5/49]
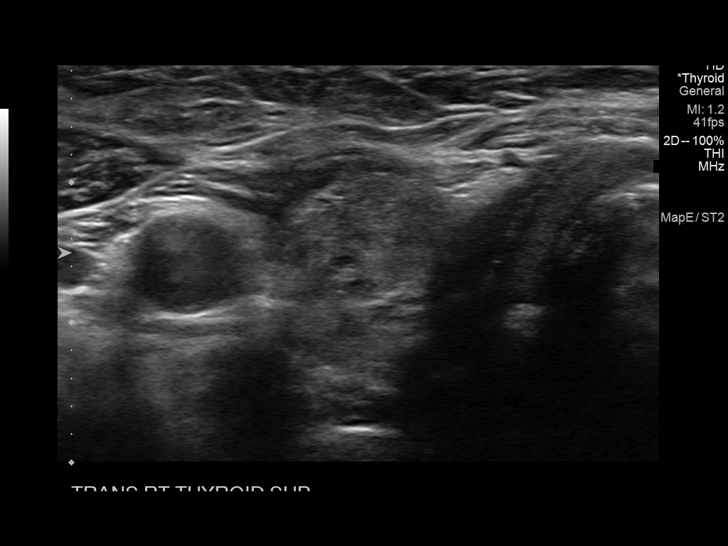
[im 9/49]
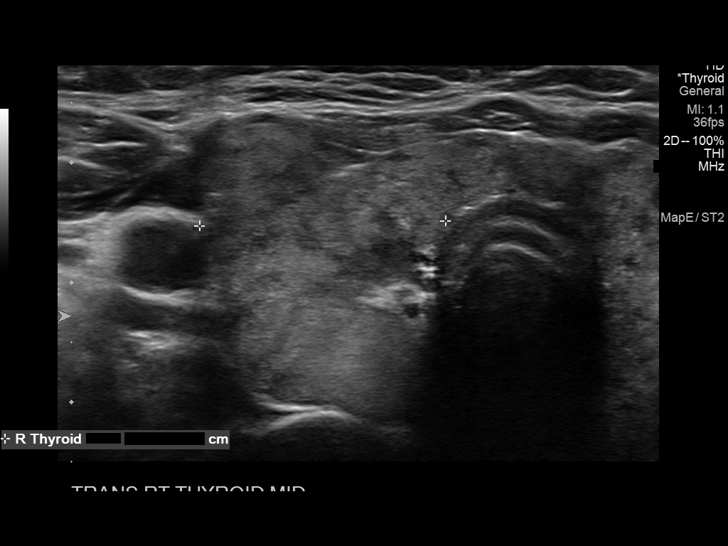
[im 13/49]
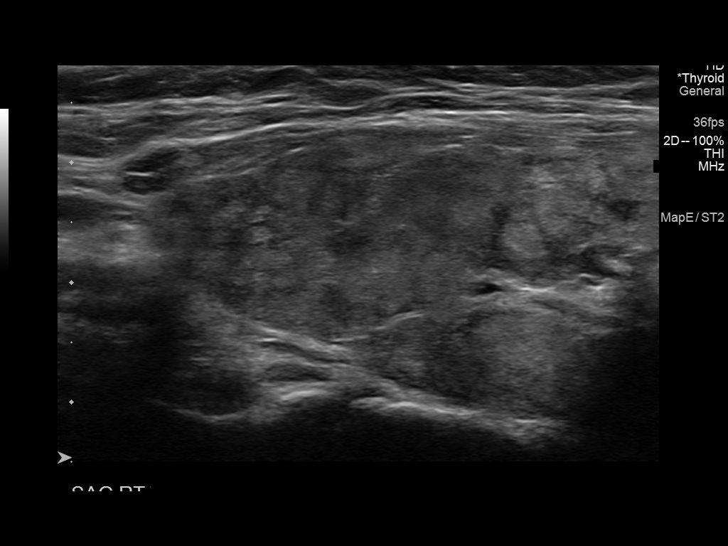
[im 17/49]
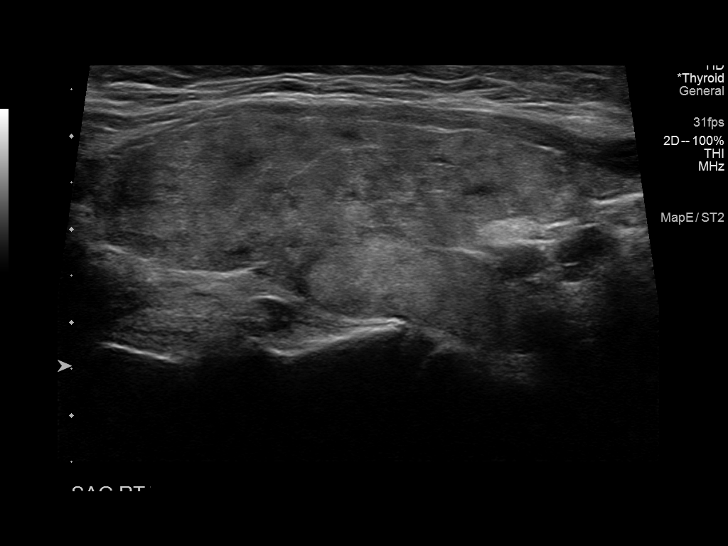
[im 19/49]
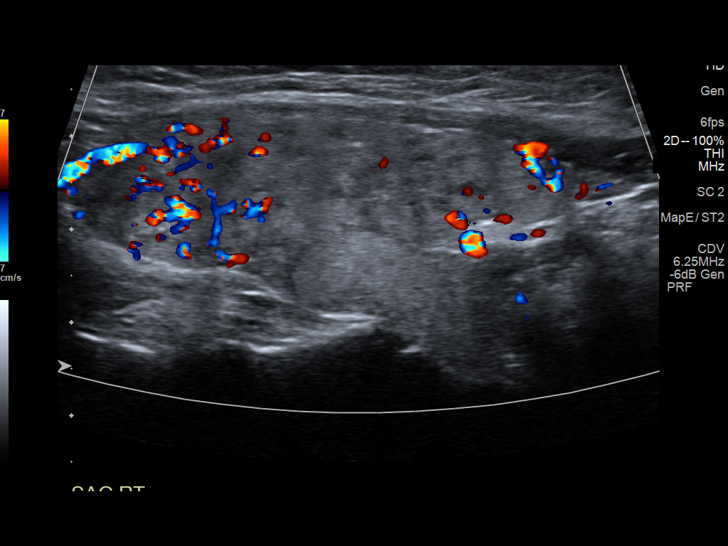
[im 23/49]
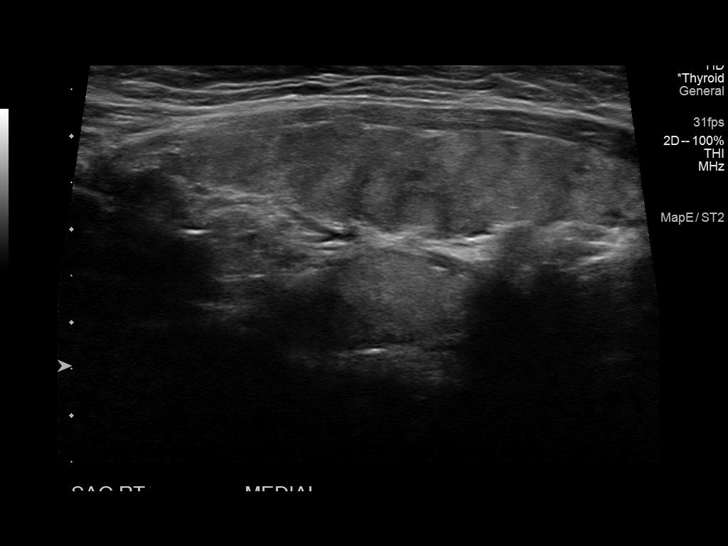
[im 27/49]
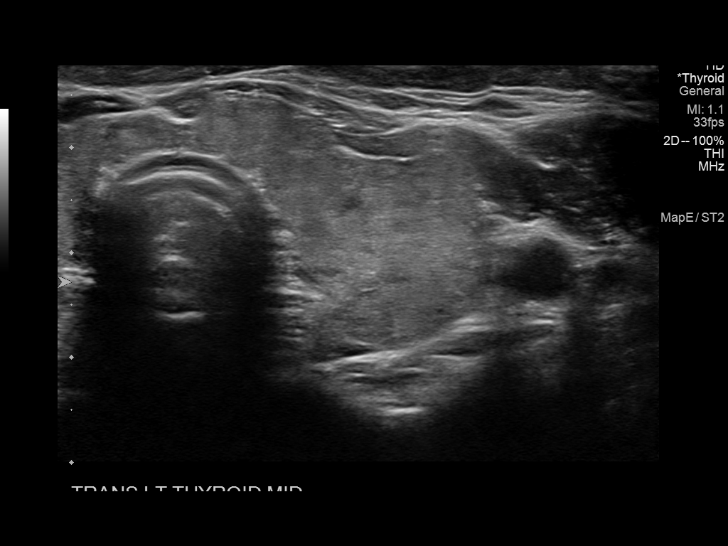
[im 31/49]
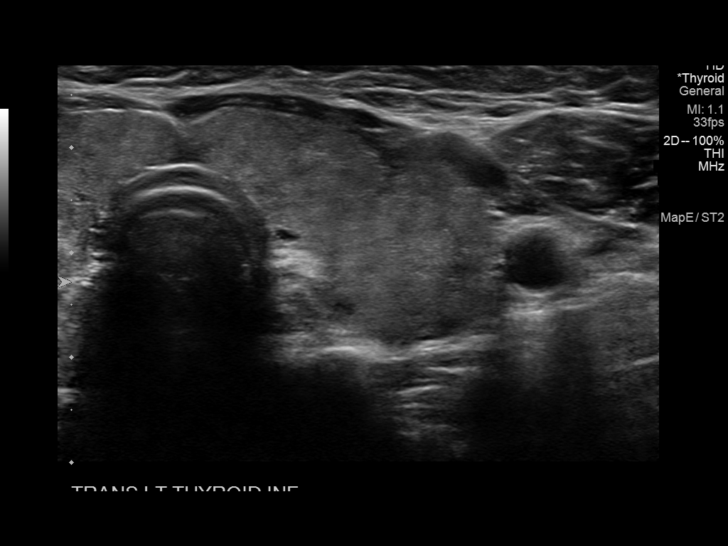
[im 33/49]
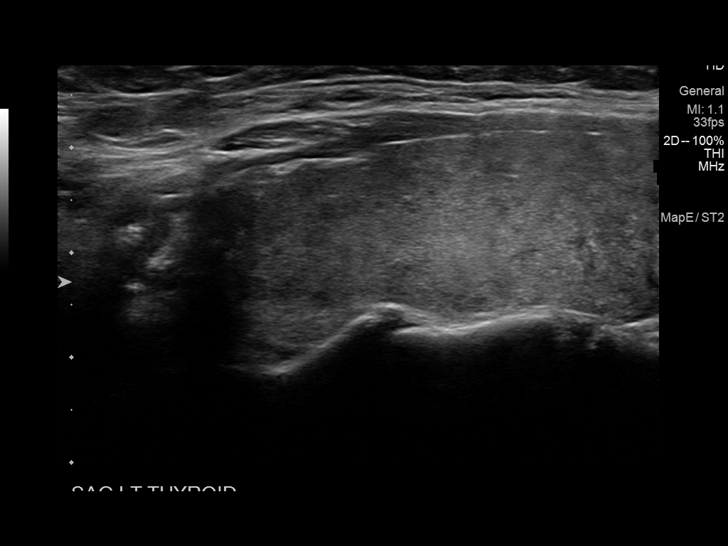
[im 37/49]
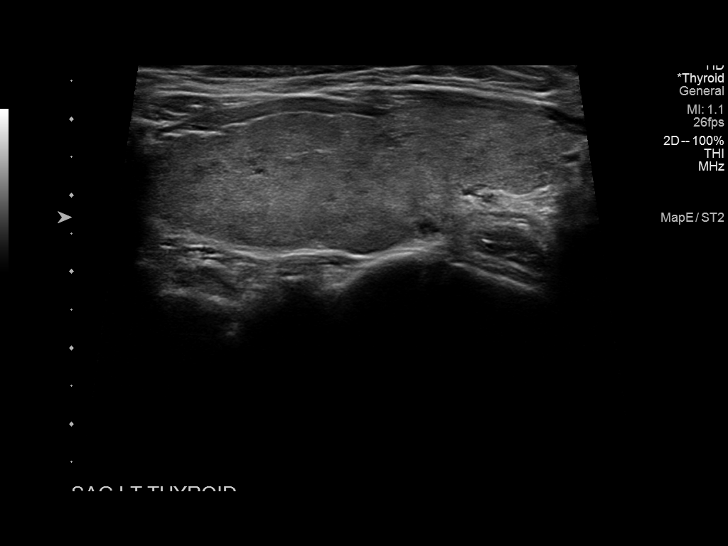
[im 41/49]
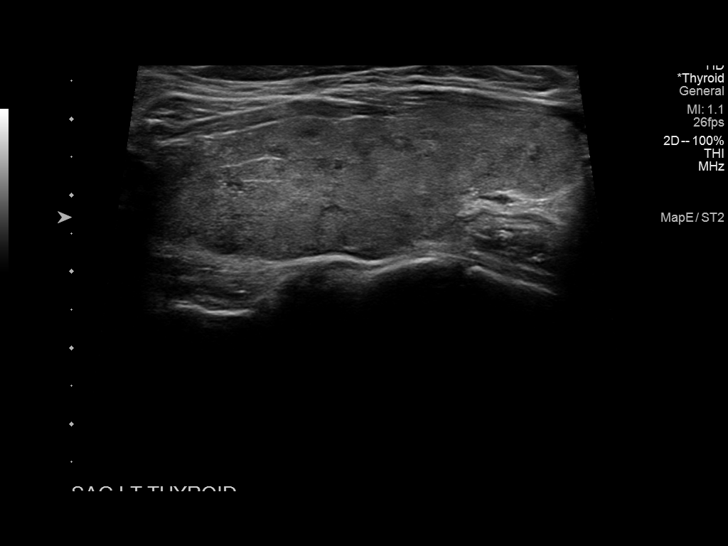
[im 45/49]
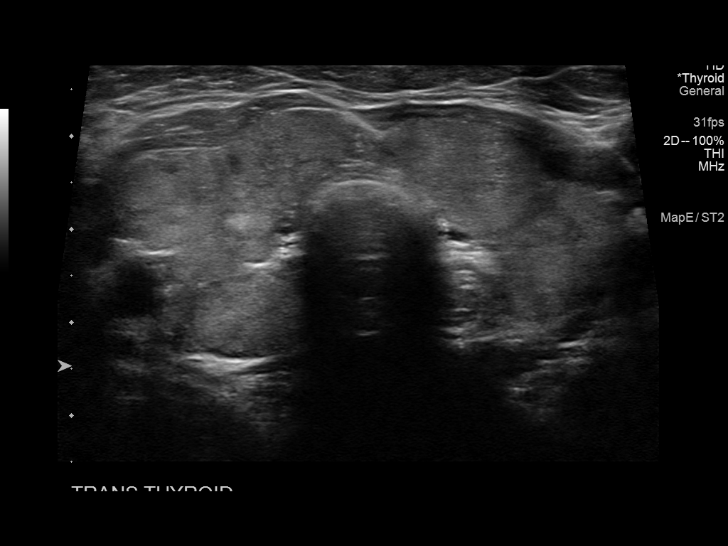
[im 49/49]
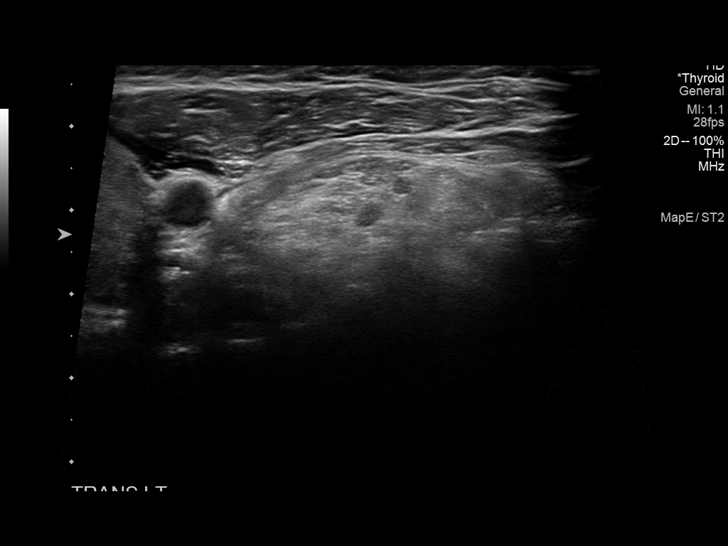

[14 of 25 positions shown; findings below may reference images not displayed]

FINDINGS: Parenchymal Echotexture: Moderately heterogenous

Isthmus: 6 mm

Right lobe: 5.9 x 2.1 x 2.1 cm

Left lobe: 5.9 x 1.8 x 2.1 cm

_________________________________________________________

Estimated total number of nodules >/= 1 cm: 0

Number of spongiform nodules >/=  2 cm not described below (TR1): 0

Number of mixed cystic and solid nodules >/= 1.5 cm not described
below (TR2): 0

_________________________________________________________

Moderately heterogeneous mildly enlarged thyroid gland with pseudo
nodularity. No discrete measurable nodule or focal abnormality. No
hypervascularity. No regional adenopathy.
IMPRESSION: Heterogeneous thyroid gland as above compatible with chronic medical
thyroid disease. Negative for nodule.

The above is in keeping with the ACR TI-RADS recommendations - [HOSPITAL] 6259;[DATE].

## 2023-08-21 NOTE — Progress Notes (Unsigned)
Patient: Autumn Harris Date of Birth: May 17, 1971  Reason for Visit: Follow up History from: Patient Primary Neurologist: Terrace Arabia   ASSESSMENT AND PLAN 52 y.o. year old female   1.  Probable pseudotumor cerebri 2.  Headache with migraine features -Off Topamax or Diamox since January 2023, denies any headaches since (does remain on propranolol, Effexor that could benefit headaches) -Encouraged to keep follow-up with eye doctor Dr. Vonna Kotyk for annual evaluation -Will remain off Topamax or Diamox  -Discussed the benefit of weight loss for long-term management of pseudotumor cerebri  3.  Tremor -Right arm, left leg with action, none seen at rest -Will closely monitor -She does not correlate with any medication adjustment.  On Vraylar, Xanax, Effexor, propranolol. No room to increase propranolol due to HR -Reportedly PCP has evaluated TSH and was told normal -Will ask her to see Dr. Terrace Arabia in 6 months to recheck tremor. I asked patient to reach out if worsens or new symptoms, can send video on my chart   HISTORY  Autumn Harris, is a 52 year old female, accompanied by her husband, seen in request by her primary care PA [address], elevation of 2 episodes severe headache, elevated opening pressure on lumbar puncture, initial evaluation was July 29, 2021   I reviewed and summarized the referring note. PMhx. Depression, anxiety. HTN   She presented to emergency room [facility] on July 16, 2021, after sudden onset of severe headache during climax, also has shortness of breath, sweating, CT head without contrast showed no significant abnormality, EKG was normal, did has transient elevated troponin, but quickly normalized on repeat test  She was given Tylenol, Zofran, hydralazine because elevated blood pressure 188/95, and discharged home  Her headache last about 24 hours, had recurrent headache again on July 19, 2021, describes severe pounding headache with light  noise sensitivity, nauseous,  This time she had a CT angiogram head and neck without significant abnormality  MRI of the brain, MRV of the brain was normal Lumbar puncture showed opening pressure of 35 cmH2O, no significant abnormality on CSF study,  She was started on Diamox 500 mg twice a day, again headache last for 24 hours, she did have transient whooshing sound in her ear, especially when lying down, now has improved, denies visual change, denies headache.  She was seen by her ophthalmologist Dr.  Mia Creek, reported normal  But over the past couple weeks, she complains of worsening depression anxiety, could not falling to sleep, tearful, stressed at today's visit She denies a previous history of frequent headaches, CSF: July 19, 2021, protein was 45, RBC 1, WBC 1, CBC showed mild elevated WBC 11.1, hemoglobin of 15.5, CMP shows sodium 131, creatinine 0.89, elevated AST 65, ALT 101, UDS was positive for marijuana twice   Update October 13, 2021 SS: Here today with husband, daughter. On Topamax 100 mg twice daily. Feels cognitive slowing, foggy. 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 No more headaches. Claims didn't give Diamox much of a chance to work, because of coming off the Vaping with THC. Saw eye doctor, Dr. Mia Creek 1 month ago, everything was good. Husband reports snoring, apnea, she admits daytime fatigue, likelihood of falling asleep during the day with normal activities. Tearful today. Anxiety and depression before, but more so lately. Taking Xanax, baclofen, Rexulti, Inderal, Effexor.   Update Feb 22, 2022 SS: Via virtual visit, currently has GI, denies any recent headaches since last seen, has stopped  Topamax and Diamox.  She feels back to herself. Felt very foggy with both Topamax and Diamox.  Seeing eye doctor, Dr. Vonna Kotyk end of the month.  No weight loss.  She is back to working 12-hour shifts.  She had canceled her sleep consult.  Her  father passed away at that time, has had a lot going on.  She remains on Effexor, propanolol, amitriptyline from PCP. Has not need Maxalt.   I received ophthalmology note, September 06, 2021, from Dr. Mia Creek.  Bilaterally, the optic nerve was pink and healthy, no disc edema  Addendum 03/21/2022 SS: I received ophthalmology evaluation from Dr. Vonna Kotyk, 03/09/22, is no longer on Topamax.  No signs of intercranial pressure were seen.  All tests are stable.  Follow-up in 6 months.  If continues to stay stable, consider annual follow-ups.  No disc edema was seen.  Update August 22, 2023 SS: has not been back to see Dr. Vonna Kotyk. Still remains off Topamax or Diamox. 3-4 migraines a month, vertex. Switched from [employer] to [employer].  Has reported tremor to the right arm, left leg, with action for the last several months, denies association with any medication adjustment.  PCP follows TSH, has told fine. For migraine will take Zofran, ibuprofen, lay in a dark room. Remains on propranolol and Effexor. Tremor very worrisome to her, she had an aunt with PD.  REVIEW OF SYSTEMS: Out of a complete 14 system review of symptoms, the patient complains only of the following symptoms, and all other reviewed systems are negative.  See HPI  ALLERGIES: No Known Allergies  HOME MEDICATIONS: Outpatient Medications Prior to Visit  Medication Sig Dispense Refill   ALPRAZolam (XANAX) 0.5 MG tablet Take 0.5 mg by mouth 3 (three) times daily as needed.     baclofen (LIORESAL) 20 MG tablet Take 20 mg by mouth at bedtime.     losartan-hydrochlorothiazide (HYZAAR) 100-25 MG tablet Take 0.5 tablets by mouth at bedtime.     MOUNJARO 5 MG/0.5ML Pen Inject 5 mg into the skin once a week.     ondansetron (ZOFRAN-ODT) 4 MG disintegrating tablet TAKE 1 TABLET BY MOUTH EVERY 8 HOURS AS NEEDED 20 tablet 6   propranolol (INDERAL) 40 MG tablet Take 40 mg by mouth 2 (two) times daily.     venlafaxine XR (EFFEXOR-XR) 150 MG 24 hr capsule  Take 150 mg by mouth daily.     VRAYLAR 1.5 MG capsule Take 1 capsule by mouth daily.     amitriptyline (ELAVIL) 10 MG tablet Take 10 mg by mouth at bedtime.     REXULTI 1 MG TABS tablet Take 0.5 mg by mouth daily.     rizatriptan (MAXALT-MLT) 10 MG disintegrating tablet Take 1 tablet (10 mg total) by mouth as needed. May repeat in 2 hours if needed (Patient not taking: Reported on 02/22/2022) 12 tablet 6   tiZANidine (ZANAFLEX) 4 MG tablet Take 1 tablet (4 mg total) by mouth every 6 (six) hours as needed. (Patient not taking: Reported on 02/22/2022) 30 tablet 6   No facility-administered medications prior to visit.    PAST MEDICAL HISTORY: Past Medical History:  Diagnosis Date   Graves disease 1994   Resolved   HTN (hypertension)    Multiple thyroid nodules    Multiple thyroid nodules    Vitamin D deficiency     PAST SURGICAL HISTORY: Past Surgical History:  Procedure Laterality Date   CESAREAN SECTION  2004   x2    FAMILY HISTORY: Family  History  Problem Relation Age of Onset   Hypertension Mother    Anxiety disorder Mother    Lung cancer Father     SOCIAL HISTORY: Social History   Socioeconomic History   Marital status: Single    Spouse name: Not on file   Number of children: 4   Years of education: Not on file   Highest education level: Not on file  Occupational History   Occupation: spinner (textile)  Tobacco Use   Smoking status: Former    Types: E-cigarettes    Quit date: 07/15/2021    Years since quitting: 2.1   Smokeless tobacco: Never  Substance and Sexual Activity   Alcohol use: Never   Drug use: Never   Sexual activity: Not on file  Other Topics Concern   Not on file  Social History Narrative   Right handed   Caffeine use: small amount daily   Social Determinants of Health   Financial Resource Strain: Not on file  Food Insecurity: Not on file  Transportation Needs: Not on file  Physical Activity: Not on file  Stress: Not on file  Social  Connections: Not on file  Intimate Partner Violence: Not on file    PHYSICAL EXAM  Vitals:   08/22/23 1546  BP: 130/84  Pulse: (!) 58  Resp: 16  Height: 5\' 7"  (1.702 m)   Body mass index is 33.39 kg/m.  Generalized: Well developed, in no acute distress  Neurological examination  Mentation: Alert oriented to time, place, history taking. Follows all commands speech and language fluent Cranial nerve II-XII: Pupils were equal round reactive to light. Extraocular movements were full, visual field were full on confrontational test. Facial sensation and strength were normal. Head turning and shoulder shrug  were normal and symmetric. Motor: The motor testing reveals 5 over 5 strength of all 4 extremities. Good symmetric motor tone is noted throughout.  I do not see any resting tremor, bradykinesia or rigidity.  Mild tremor to the right arm when outstretched. Sensory: Sensory testing is intact to soft touch on all 4 extremities. No evidence of extinction is noted.  Coordination: Cerebellar testing reveals good finger-nose-finger and heel-to-shin bilaterally.  No tremor noted.  Handwriting sample is well maintained. Gait and station: Gait is normal.  Normal stride, arm swing. Reflexes: Deep tendon reflexes are symmetric and normal bilaterally.   DIAGNOSTIC DATA (LABS, IMAGING, TESTING) - I reviewed patient records, labs, notes, testing and imaging myself where available.  No results found for: "WBC", "HGB", "HCT", "MCV", "PLT" No results found for: "NA", "K", "CL", "CO2", "GLUCOSE", "BUN", "CREATININE", "CALCIUM", "PROT", "ALBUMIN", "AST", "ALT", "ALKPHOS", "BILITOT", "GFRNONAA", "GFRAA" No results found for: "CHOL", "HDL", "LDLCALC", "LDLDIRECT", "TRIG", "CHOLHDL" No results found for: "HGBA1C" No results found for: "ZOXWRUEA54" Lab Results  Component Value Date   TSH 3.00 11/16/2021    Margie Ege, AGNP-C, DNP 08/22/2023, 3:50 PM [facility] [address] [place], [place] [address] [phone]

## 2023-08-22 ENCOUNTER — Encounter: Payer: Self-pay | Admitting: Neurology

## 2023-08-22 ENCOUNTER — Ambulatory Visit: Payer: BC Managed Care – PPO | Admitting: Neurology

## 2023-08-22 VITALS — BP 130/84 | HR 58 | Resp 16 | Ht 67.0 in

## 2023-08-22 DIAGNOSIS — G43009 Migraine without aura, not intractable, without status migrainosus: Secondary | ICD-10-CM

## 2023-08-22 DIAGNOSIS — F419 Anxiety disorder, unspecified: Secondary | ICD-10-CM | POA: Diagnosis not present

## 2023-08-22 DIAGNOSIS — G932 Benign intracranial hypertension: Secondary | ICD-10-CM

## 2023-08-22 DIAGNOSIS — G43909 Migraine, unspecified, not intractable, without status migrainosus: Secondary | ICD-10-CM | POA: Insufficient documentation

## 2023-08-22 NOTE — Patient Instructions (Signed)
Follow up with your eye doctor  Monitor your tremor

## 2023-10-01 ENCOUNTER — Ambulatory Visit: Payer: BC Managed Care – PPO | Admitting: Internal Medicine

## 2023-10-12 ENCOUNTER — Ambulatory Visit: Payer: BC Managed Care – PPO | Admitting: Internal Medicine

## 2023-10-12 NOTE — Progress Notes (Deleted)
Patient ID: Autumn Harris, female   DOB: [birth year], 52 y.o.   MRN: [id number]  HPI  Autumn Harris is a 52 y.o.-year-old female, initially referred by her PCP, Letitia Neri, PA/Dr. Ernst Breach at at [facility], returning for follow-up for thyroid nodules and history of Graves' disease.   Last visit 1 year and 10 months ago.  Interim history: Before last visit, she was diagnosed with pseudotumor cerebri in 06/2021. She was started on Diamox, then switched to Topamax. However, her intraocular pressure increased, had blurry vision, foggy mind, memory problems >> stopped Topamax. At last visit, she was feeling much better and lost weight: 26 lbs - since 06/2021.  She had less fatigue.   Since last visit, she was started on Ozempic, and then [facility].  She did have a high HbA1c 04/16/2023: 6.8% but a subsequent HbA1c on 07/17/2023 was 5.4%.  Graves' disease - diagnosed in 1994, when pregnant with her daughter. She had elevated thyroid antibodies and a positive scan.  - She was on PTU then for years. She came off ~10 years ago.  - She then was off medications during the next 2 pregnancies, but after the pregnancies, she developed hypothyroidism. Last pregnancy was in 2005. She was started on LT4 then, a low dose, 12.5 mcg for many years.  However, she was taking it with creamer and multivitamins so likely not absorbing it.  We stopped the medication with subsequent normal TFTs:  Lab Results  Component Value Date   TSH 3.00 11/16/2021   FREET4 0.87 11/16/2021  07/16/2021: TSH 2.3 09/23/2020: TSH 2.05 (0.278-4.326), free T4 1.09 02/12/2020: TSH 0.808, free T4 1.15 She is on  vitamin D, C, and B complex (30 mcg Biotin) in am.  Thyroid U/S (11/23/2020): 2 isoechoic nodules or pseudonodules: Right lobe: 2.7 x 6 x 2.6 cm Left lobe: 1.9 x 5.6 x 2.2 cm Isthmus 0.4 cm Thyroid appears enlarged.  Thyroid echotexture appears heterogeneous bilaterally.  Parenchymal vascularity is  within normal range. There are a few isoechoic predominantly solid oval or round nodules in the right thyroid with the largest measuring 1.9 x 10 mm. There are a few isoechoic predominantly solid oval or round nodules in the left thyroid with the largest measuring 12 x 12 mm.   Nodules with this appearance are typically classified as low risk for malignancy.  Some nodules could be parts of larger nodules or be due to heterogeneous echotexture.   Given nodule size, follow-up is recommended in 1 to 2 years, however evaluation with nuclear medicine thyroid scan can be considered.  Thyroid U/S (12/05/2021): Parenchymal Echotexture: Moderately heterogenous  Isthmus: 6 mm  Right lobe: 5.9 x 2.1 x 2.1 cm  Left lobe: 5.9 x 1.8 x 2.1 cm   Moderately heterogeneous mildly enlarged thyroid gland with pseudo nodularity. No discrete measurable nodule or focal abnormality. No hypervascularity. No regional adenopathy.   IMPRESSION: Heterogeneous thyroid gland as above compatible with chronic medical thyroid disease. Negative for nodule.  Pt denies: - feeling nodules in neck - hoarseness - dysphagia - choking  + FH of thyroid ds. No FH of thyroid cancer. No h/o radiation tx to head or neck. No recent contrast studies. No steroid use. No herbal supplements. + Low-dose biotin supplement.  Pt also has a history of transaminitis, HTN (on propranolol for almost 30 years and also HCTZ and lisinopril), depression, anxiety, vitamin D deficiency.   ROS: + see HPI  Past Medical History:  Diagnosis Date   Graves disease  1994   Resolved   HTN (hypertension)    Multiple thyroid nodules    Multiple thyroid nodules    Vitamin D deficiency    Social History   Socioeconomic History   Marital status: Single    Spouse name: Not on file   Number of children: 4   Years of education: Not on file   Highest education level: Not on file  Occupational History   Occupation: spinner (textile)  Tobacco Use    Smoking status: Former    Types: E-cigarettes    Quit date: 07/15/2021    Years since quitting: 2.2   Smokeless tobacco: Never  Substance and Sexual Activity   Alcohol use: Never   Drug use: Never   Sexual activity: Not on file  Other Topics Concern   Not on file  Social History Narrative   Right handed   Caffeine use: small amount daily   Social Drivers of Health   Financial Resource Strain: Not on file  Food Insecurity: Not on file  Transportation Needs: Not on file  Physical Activity: Not on file  Stress: Not on file  Social Connections: Not on file  Intimate Partner Violence: Not on file   Current Outpatient Medications on File Prior to Visit  Medication Sig Dispense Refill   ALPRAZolam (XANAX) 0.5 MG tablet Take 0.5 mg by mouth 3 (three) times daily as needed.     baclofen (LIORESAL) 20 MG tablet Take 20 mg by mouth at bedtime.     losartan-hydrochlorothiazide (HYZAAR) 100-25 MG tablet Take 0.5 tablets by mouth at bedtime.     MOUNJARO 5 MG/0.5ML Pen Inject 5 mg into the skin once a week.     ondansetron (ZOFRAN-ODT) 4 MG disintegrating tablet TAKE 1 TABLET BY MOUTH EVERY 8 HOURS AS NEEDED 20 tablet 6   propranolol (INDERAL) 40 MG tablet Take 40 mg by mouth 2 (two) times daily.     venlafaxine XR (EFFEXOR-XR) 150 MG 24 hr capsule Take 150 mg by mouth daily.     VRAYLAR 1.5 MG capsule Take 1 capsule by mouth daily.     No current facility-administered medications on file prior to visit.   No Known Allergies Family History  Problem Relation Age of Onset   Hypertension Mother    Anxiety disorder Mother    Lung cancer Father    PE: LMP  (LMP Unknown)  Wt Readings from Last 3 Encounters:  11/16/21 213 lb 3.2 oz (96.7 kg)  10/13/21 211 lb 8 oz (95.9 kg)  07/29/21 211 lb (95.7 kg)   Constitutional: overweight, in NAD Eyes:  EOMI, no exophthalmos ENT: no neck masses, no cervical lymphadenopathy Cardiovascular: RRR, No MRG Respiratory: CTA B Musculoskeletal: no  deformities Skin:no rashes Neurological: no tremor with outstretched hands  ASSESSMENT: 1. Thyroid nodules  2.  History of Graves' disease - resolved  PLAN: 1. Thyroid nodules -Patient has thyroid nodules per review of the ultrasound report from 11/2020.  Unfortunately, I do not have the images.  The thyroid was reported to be slightly enlarged, with heterogeneous aspect, consistent with her history of thyroid inflammation in the setting of Graves' disease.  She had 2 dominant nodules, 1.9 cm in the right lobe and 1.2 cm in the left lobe, along with other, smaller, nodules, scattered throughout the thyroid, not formally characterized on the report.  The dominant nodules appeared to be round or oval in shape and they were isoechoic in appearance, which would point towards benign findings.  There was  no comment about internal calcifications, internal blood flow, taller than wide disposition to imply an increased risk of malignancy -After last visit, we checked another thyroid ultrasound (12/05/2021) and this showed no nodules, only heterogeneity.  We discussed that no imaging follow-up is needed for this. -She does not have a thyroid cancer family history or personal history of radiation therapy to head or neck to increase her own risk of thyroid cancer -No neck compression symptoms -I will see her back in a year  2.  History of Graves' disease -Diagnosed many years ago, during her pregnancy -She was initially on PTU, but then came off and developed hypothyroidism more than 10 years ago -She was previously on 12.5 mcg of levothyroxine daily, however she was not taking it correctly.  She was taking it along with multivitamins and also coffee + creamer.  We discussed that she likely was not absorbing it.  Indeed, we stopped the medication and subsequent TFTs were normal. -Latest TSH was normal in 11/2021.  The free thyroid hormones were also normal at that time -We will recheck her TFTs  today  Carlus Pavlov, MD PhD [facility]

## 2024-02-21 ENCOUNTER — Ambulatory Visit: Payer: BC Managed Care – PPO | Admitting: Neurology

## 2024-02-21 ENCOUNTER — Encounter: Payer: Self-pay | Admitting: Neurology

## 2024-02-21 VITALS — BP 128/88 | HR 77 | Ht 67.0 in | Wt 213.2 lb

## 2024-02-21 DIAGNOSIS — G43009 Migraine without aura, not intractable, without status migrainosus: Secondary | ICD-10-CM

## 2024-02-21 DIAGNOSIS — R251 Tremor, unspecified: Secondary | ICD-10-CM

## 2024-02-21 DIAGNOSIS — G932 Benign intracranial hypertension: Secondary | ICD-10-CM | POA: Diagnosis not present

## 2024-02-21 DIAGNOSIS — F419 Anxiety disorder, unspecified: Secondary | ICD-10-CM | POA: Diagnosis not present

## 2024-02-21 MED ORDER — SUMATRIPTAN SUCCINATE 50 MG PO TABS: ORAL_TABLET | ORAL | 6 refills | Status: AC

## 2024-02-21 NOTE — Progress Notes (Signed)
 ASSESSMENT AND PLAN 53 y.o. year old female   History of pseudotumor cerebri  That was confirmed by abnormal lumbar puncture in 2022 with opening pressure of 35 cm water  Has been under frequent ophthalmology evaluation, was reported normal  She could not tolerate Diamox , Topamax  due to side effect,  Has significant weight loss since starting Mounjaro in September 2024, feeling better Depression anxiety  On Effexor xr 150 mg daily Essential tremor  Taking propranolol 40 mg twice a day  Headache with migraine features Off Topamax  or Diamox  since January 2023, Still has frequent, but milder headache, respond to NSAIDs, do have light noise sensitivity, Imitrex as needed for moderate to severe headache  She entered into menopause, have some symptoms, is seen by primary care physician have suggested low-dose hormone supplement, I see no contraindication for that.  She will continue follow-up with her primary care, only return to clinic for new issues   DIAGNOSTIC DATA (LABS, IMAGING, TESTING) - I reviewed patient records, labs, notes, testing and imaging myself where available.     HISTORY : Ayza Delich, is a 53 year old female, accompanied by her husband, seen in request by her primary care PA [address], elevation of 2 episodes severe headache, elevated opening pressure on lumbar puncture, initial evaluation was July 29, 2021   I reviewed and summarized the referring note. PMhx. Depression, anxiety. HTN   She presented to emergency room [facility] on July 16, 2021, after sudden onset of severe headache during climax, also has shortness of breath, sweating, CT head without contrast showed no significant abnormality, EKG was normal, did has transient elevated troponin, but quickly normalized on repeat test  She was given Tylenol, Zofran , hydralazine because elevated blood pressure 188/95, and discharged home  Her headache last about 24 hours, had  recurrent headache again on July 19, 2021, describes severe pounding headache with light noise sensitivity, nauseous,  This time she had a CT angiogram head and neck without significant abnormality  MRI of the brain, MRV of the brain was normal Lumbar puncture showed opening pressure of 35 cmH2O, no significant abnormality on CSF study,  She was started on Diamox  500 mg twice a day, again headache last for 24 hours, she did have transient whooshing sound in her ear, especially when lying down, now has improved, denies visual change, denies headache.  She was seen by her ophthalmologist Dr.  Ronni Colace, reported normal  But over the past couple weeks, she complains of worsening depression anxiety, could not falling to sleep, tearful, stressed at today's visit She denies a previous history of frequent headaches, CSF: July 19, 2021, protein was 45, RBC 1, WBC 1, CBC showed mild elevated WBC 11.1, hemoglobin of 15.5, CMP shows sodium 131, creatinine 0.89, elevated AST 65, ALT 101, UDS was positive for marijuana twice  UPDATE May 8th 2025: She is seen ophthalmologist twice a year, doing well, Topamax  100mg  bid, cognitive slow, aweful, diamox  similar side effect,   She has depression anxiety, taking Effexor, also Xanax 0.5 mg up to 3 tablets a day, which has helped her sleep  She works 8 hours daily at [employer], dayshift, sleep well most of the time  Continue has frequent milder headache, can have light noise sensitivity occasionally nausea, Aleve as needed is helpful,   She recently started on Mounjaro about 9 months ago, with significant weight loss, feeling better,  She has mild bilateral hand tremor.  PHYSICAL EXAM  Vitals:  02/21/24 1500  BP: 128/88  Pulse: 77  SpO2: 96%  Weight: 213 lb 3 oz (96.7 kg)  Height: 5\' 7"  (1.702 m)   Body mass index is 33.39 kg/m.     PHYSICAL EXAMNIATION:  Gen: NAD, conversant, well nourised, well groomed                      Cardiovascular: Regular rate rhythm, no peripheral edema, warm, nontender. Eyes: Conjunctivae clear without exudates or hemorrhage Neck: Supple, no carotid bruits. Pulmonary: Clear to auscultation bilaterally   NEUROLOGICAL EXAM:  MENTAL STATUS: Speech/cognition: Awake, alert oriented to history taking and casual conversation  CRANIAL NERVES: CN II: Visual fields are full to confrontation.  Pupils are round equal and briskly reactive to light. Fundoscope was attempted,  It was difficulty to visualize the optic disc. CN III, IV, VI: extraocular movement are normal. No ptosis. CN V: Facial sensation is intact to pinprick in all 3 divisions bilaterally. Corneal responses are intact.  CN VII: Face is symmetric with normal eye closure and smile. CN VIII: Hearing is normal to casual conversation CN IX, X: Palate elevates symmetrically. Phonation is normal. CN XI: Head turning and shoulder shrug are intact CN XII: Tongue is midline with normal movements and no atrophy.  MOTOR: There is no pronator drift of out-stretched arms. Muscle bulk and tone are normal. Muscle strength is normal.  REFLEXES: Reflexes are 2+ and symmetric at the biceps, triceps, knees, and ankles. Plantar responses are flexor.  SENSORY: Intact to light touch, pinprick, positional and vibratory sensation are intact in fingers and toes.  COORDINATION: Rapid alternating movements and fine finger movements are intact. There is no dysmetria on finger-to-nose and heel-knee-shin.    GAIT/STANCE: Posture is normal. Gait is steady     REVIEW OF SYSTEMS: Out of a complete 14 system review of symptoms, the patient complains only of the following symptoms, and all other reviewed systems are negative.  See HPI  ALLERGIES: No Known Allergies  HOME MEDICATIONS: Outpatient Medications Prior to Visit  Medication Sig Dispense Refill   ALPRAZolam (XANAX) 0.5 MG tablet Take 0.5 mg by mouth 3 (three) times daily as needed.      baclofen (LIORESAL) 20 MG tablet Take 20 mg by mouth at bedtime.     losartan-hydrochlorothiazide (HYZAAR) 100-25 MG tablet Take 0.5 tablets by mouth at bedtime.     MOUNJARO 5 MG/0.5ML Pen Inject 5 mg into the skin once a week.     ondansetron  (ZOFRAN -ODT) 4 MG disintegrating tablet TAKE 1 TABLET BY MOUTH EVERY 8 HOURS AS NEEDED 20 tablet 6   propranolol (INDERAL) 40 MG tablet Take 40 mg by mouth 2 (two) times daily.     venlafaxine XR (EFFEXOR-XR) 150 MG 24 hr capsule Take 150 mg by mouth daily.     VRAYLAR 1.5 MG capsule Take 1 capsule by mouth daily.     No facility-administered medications prior to visit.    PAST MEDICAL HISTORY: Past Medical History:  Diagnosis Date   Graves disease 1994   Resolved   HTN (hypertension)    Multiple thyroid  nodules    Multiple thyroid  nodules    Vitamin D deficiency     PAST SURGICAL HISTORY: Past Surgical History:  Procedure Laterality Date   CESAREAN SECTION  2004   x2    FAMILY HISTORY: Family History  Problem Relation Age of Onset   Hypertension Mother    Anxiety disorder Mother    Lung cancer Father  SOCIAL HISTORY: Social History   Socioeconomic History   Marital status: Single    Spouse name: Not on file   Number of children: 4   Years of education: Not on file   Highest education level: Not on file  Occupational History   Occupation: spinner (textile)  Tobacco Use   Smoking status: Former    Types: E-cigarettes    Quit date: 07/15/2021    Years since quitting: 2.6   Smokeless tobacco: Never  Substance and Sexual Activity   Alcohol use: Never   Drug use: Never   Sexual activity: Not on file  Other Topics Concern   Not on file  Social History Narrative   Right handed   Caffeine use: small amount daily   Social Drivers of Health   Financial Resource Strain: Not on file  Food Insecurity: Not on file  Transportation Needs: Not on file  Physical Activity: Not on file  Stress: Not on file  Social  Connections: Not on file  Intimate Partner Violence: Not on file   Phebe Brasil, M.D. Ph.D.  [facility] [address] [place], [place] [address] Phone: [phone] Fax:      [phone]

## 2024-04-30 NOTE — Progress Notes (Signed)
 Addendum 04/30/2024 SS: Received ophthalmology evaluation notes from Dr. Lavonia 04/24/2024.  No signs of intercranial pressure today from eye standpoint.  Follow-up in 9 months.

## 2024-12-15 ENCOUNTER — Ambulatory Visit: Admitting: Neurology
